# Patient Record
Sex: Female | Born: 1960 | Race: White | Hispanic: No | Smoking: Never smoker
Health system: Southern US, Community
[De-identification: ages and names within clinical notes are randomized; demographics above are authoritative.]

## PROBLEM LIST (undated history)

## (undated) DIAGNOSIS — E7800 Pure hypercholesterolemia, unspecified: Secondary | ICD-10-CM

## (undated) DIAGNOSIS — N879 Dysplasia of cervix uteri, unspecified: Secondary | ICD-10-CM

## (undated) DIAGNOSIS — M47812 Spondylosis without myelopathy or radiculopathy, cervical region: Secondary | ICD-10-CM

## (undated) DIAGNOSIS — F341 Dysthymic disorder: Secondary | ICD-10-CM

## (undated) DIAGNOSIS — R42 Dizziness and giddiness: Secondary | ICD-10-CM

## (undated) DIAGNOSIS — L981 Factitial dermatitis: Secondary | ICD-10-CM

## (undated) DIAGNOSIS — F424 Excoriation (skin-picking) disorder: Secondary | ICD-10-CM

## (undated) DIAGNOSIS — F419 Anxiety disorder, unspecified: Secondary | ICD-10-CM

## (undated) DIAGNOSIS — G609 Hereditary and idiopathic neuropathy, unspecified: Secondary | ICD-10-CM

## (undated) DIAGNOSIS — G9332 Myalgic encephalomyelitis/chronic fatigue syndrome: Secondary | ICD-10-CM

## (undated) DIAGNOSIS — M722 Plantar fascial fibromatosis: Secondary | ICD-10-CM

## (undated) DIAGNOSIS — R5382 Chronic fatigue, unspecified: Secondary | ICD-10-CM

## (undated) DIAGNOSIS — E669 Obesity, unspecified: Secondary | ICD-10-CM

## (undated) DIAGNOSIS — M797 Fibromyalgia: Secondary | ICD-10-CM

## (undated) HISTORY — PX: ADENOIDECTOMY: SUR15

## (undated) HISTORY — PX: TUBAL LIGATION: SHX77

## (undated) HISTORY — PX: CARPAL TUNNEL RELEASE: SHX101

## (undated) HISTORY — PX: OTHER SURGICAL HISTORY: SHX169

## (undated) HISTORY — DX: Dysplasia of cervix uteri, unspecified: N87.9

## (undated) HISTORY — PX: TONSILLECTOMY: SUR1361

## (undated) HISTORY — PX: ABDOMINAL HYSTERECTOMY: SHX81

---

## 1980-10-18 DIAGNOSIS — H8110 Benign paroxysmal vertigo, unspecified ear: Secondary | ICD-10-CM | POA: Insufficient documentation

## 1993-04-20 DIAGNOSIS — G43109 Migraine with aura, not intractable, without status migrainosus: Secondary | ICD-10-CM | POA: Insufficient documentation

## 1994-11-19 DIAGNOSIS — M797 Fibromyalgia: Secondary | ICD-10-CM | POA: Insufficient documentation

## 1995-08-19 DIAGNOSIS — M797 Fibromyalgia: Secondary | ICD-10-CM | POA: Insufficient documentation

## 1995-08-19 DIAGNOSIS — G9332 Myalgic encephalomyelitis/chronic fatigue syndrome: Secondary | ICD-10-CM | POA: Insufficient documentation

## 1997-04-20 DIAGNOSIS — K589 Irritable bowel syndrome without diarrhea: Secondary | ICD-10-CM | POA: Insufficient documentation

## 1999-04-21 DIAGNOSIS — F424 Excoriation (skin-picking) disorder: Secondary | ICD-10-CM | POA: Insufficient documentation

## 2011-05-11 ENCOUNTER — Emergency Department (INDEPENDENT_AMBULATORY_CARE_PROVIDER_SITE_OTHER)
Admission: EM | Admit: 2011-05-11 | Discharge: 2011-05-11 | Disposition: A | Discharge status: Home / Self Care | Payer: BC Managed Care – PPO | Source: Home / Self Care | Attending: Emergency Medicine | Admitting: Emergency Medicine

## 2011-05-11 DIAGNOSIS — J029 Acute pharyngitis, unspecified: Secondary | ICD-10-CM

## 2011-05-11 DIAGNOSIS — J069 Acute upper respiratory infection, unspecified: Secondary | ICD-10-CM

## 2011-05-11 HISTORY — DX: Dizziness and giddiness: R42

## 2011-05-11 HISTORY — DX: Chronic fatigue, unspecified: R53.82

## 2011-05-11 HISTORY — DX: Anxiety disorder, unspecified: F41.9

## 2011-05-11 HISTORY — DX: Pure hypercholesterolemia, unspecified: E78.00

## 2011-05-11 HISTORY — DX: Spondylosis without myelopathy or radiculopathy, cervical region: M47.812

## 2011-05-11 HISTORY — DX: Excoriation (skin-picking) disorder: F42.4

## 2011-05-11 HISTORY — DX: Fibromyalgia: M79.7

## 2011-05-11 HISTORY — DX: Factitial dermatitis: L98.1

## 2011-05-11 HISTORY — DX: Dysthymic disorder: F34.1

## 2011-05-11 HISTORY — DX: Obesity, unspecified: E66.9

## 2011-05-11 HISTORY — DX: Myalgic encephalomyelitis/chronic fatigue syndrome: G93.32

## 2011-05-11 HISTORY — DX: Plantar fascial fibromatosis: M72.2

## 2011-05-11 HISTORY — DX: Hereditary and idiopathic neuropathy, unspecified: G60.9

## 2011-05-11 LAB — POCT RAPID STREP A (OFFICE): Rapid Strep A Screen: NEGATIVE

## 2011-05-11 MED ORDER — AZITHROMYCIN 250 MG PO TABS: ORAL_TABLET | ORAL | Status: AC

## 2011-05-11 NOTE — ED Provider Notes (Addendum)
History     CSN: [id number]  Arrival date & time 05/11/11  1011   First MD Initiated Contact with Patient 05/11/11 1016      No chief complaint on file.   (Consider location/radiation/quality/duration/timing/severity/associated sxs/prior treatment) HPI Noeli is a 51 y.o. female who complains of onset of cold symptoms for a few days. Flonase is helping. + sore throat + cough No pleuritic pain No wheezing + nasal congestion + post-nasal drainage No sinus pain/pressure No chest congestion No itchy/red eyes No earache No hemoptysis No SOB No chills/sweats No fever No nausea No vomiting No abdominal pain No diarrhea No skin rashes No fatigue No myalgias No headache    No past medical history on file.  No past surgical history on file.  No family history on file.  History  Substance Use Topics  . Smoking status: Not on file  . Smokeless tobacco: Not on file  . Alcohol Use: Not on file    OB History    No data available      Review of Systems  Allergies  Review of patient's allergies indicates not on file.  Home Medications  No current outpatient prescriptions on file.  There were no vitals taken for this visit.  Physical Exam  Nursing note and vitals reviewed. Constitutional: She is oriented to person, place, and time. She appears well-developed and well-nourished.  HENT:  Head: Normocephalic and atraumatic.  Right Ear: Tympanic membrane, external ear and ear canal normal.  Left Ear: Tympanic membrane, external ear and ear canal normal.  Nose: Mucosal edema and rhinorrhea present.  Mouth/Throat: Posterior oropharyngeal erythema present. No oropharyngeal exudate or posterior oropharyngeal edema.  Eyes: No scleral icterus.  Neck: Neck supple.  Cardiovascular: Regular rhythm and normal heart sounds.   Pulmonary/Chest: Effort normal and breath sounds normal. No respiratory distress.  Neurological: She is alert and oriented to person, place, and  time.  Skin: Skin is warm and dry.  Psychiatric: She has a normal mood and affect. Her speech is normal.    ED Course  Procedures (including critical care time)  Labs Reviewed - No data to display No results found.   No diagnosis found.    MDM  1)  Take the prescribed antibiotic as instructed.  She is allergic to most classes of antibiotics but states that she can take a Z-Pak without any problems.  A rapid strep test is negative and a throat culture is pending. 2)  Use nasal saline solution (over the counter) at least 3 times a day. 3)  Use over the counter decongestants like Zyrtec-D every 12 hours as needed to help with congestion.  If you have hypertension, do not take medicines with sudafed.  4)  Can take tylenol every 6 hours or motrin every 8 hours for pain or fever. 5)  Follow up with your primary doctor if no improvement in 5-7 days, sooner if increasing pain, fever, or new symptoms.       Lily Kocher, MD 05/11/11 1046

## 2011-05-11 NOTE — ED Notes (Signed)
Pt c/o sore throat for past 4 days.

## 2011-05-12 LAB — STREP A DNA PROBE: GASP: NEGATIVE

## 2011-05-13 ENCOUNTER — Telehealth: Payer: Self-pay | Admitting: Emergency Medicine

## 2011-06-20 ENCOUNTER — Emergency Department
Admit: 2011-06-20 | Discharge: 2011-06-20 | Disposition: A | Discharge status: Home / Self Care | Payer: BC Managed Care – PPO

## 2011-06-20 ENCOUNTER — Emergency Department (INDEPENDENT_AMBULATORY_CARE_PROVIDER_SITE_OTHER)
Admission: EM | Admit: 2011-06-20 | Discharge: 2011-06-20 | Disposition: A | Discharge status: Home / Self Care | Payer: BC Managed Care – PPO | Source: Home / Self Care | Attending: Family Medicine | Admitting: Family Medicine

## 2011-06-20 DIAGNOSIS — R079 Chest pain, unspecified: Secondary | ICD-10-CM

## 2011-06-20 DIAGNOSIS — Z8739 Personal history of other diseases of the musculoskeletal system and connective tissue: Secondary | ICD-10-CM

## 2011-06-20 DIAGNOSIS — R0781 Pleurodynia: Secondary | ICD-10-CM

## 2011-06-20 DIAGNOSIS — M940 Chondrocostal junction syndrome [Tietze]: Secondary | ICD-10-CM

## 2011-06-20 DIAGNOSIS — R059 Cough, unspecified: Secondary | ICD-10-CM

## 2011-06-20 DIAGNOSIS — R05 Cough: Secondary | ICD-10-CM

## 2011-06-20 NOTE — ED Provider Notes (Signed)
History     CSN: [id number]  Arrival date & time 06/20/11  1401   First MD Initiated Contact with Patient 06/20/11 1423      Chief Complaint  Patient presents with  . Chest Pain    right side pain under right breast      HPI Comments: Patient had a cough about 3 weeks ago.  She felt a sudden painful pulling sensation in her right anterior/lateral chest while coughing that has persisted.  The pain is occasionally burning/tingling.  No rash.  No fevers, chills, and sweats.  She occasionally feels short of breath because of the pain.  The pain does not radiate.  She has a history of costochondritis and fibromyalgia.  Patient is a 51 y.o. female presenting with chest pain. The history is provided by the patient.  Chest Pain Episode onset: 3 weeks ago. Chest pain occurs constantly. The chest pain is unchanged. The pain is associated with breathing and coughing. At its most intense, the pain is at 6/10. The severity of the pain is moderate. The quality of the pain is described as stabbing and sharp. The pain does not radiate. Chest pain is worsened by certain positions and deep breathing. Pertinent negatives for primary symptoms include no fever, no fatigue, no syncope, no shortness of breath, no cough, no wheezing, no palpitations, no abdominal pain, no nausea and no vomiting.  Pertinent negatives for associated symptoms include no lower extremity edema. Risk factors include obesity.     Past Medical History  Diagnosis Date  . Fibromyalgia   . Chronic fatigue syndrome   . Hypercholesteremia   . Dysthymia   . Anxiety   . Hypotension   . Dermatillomania   . Idiopathic neuropathy   . Plantar fasciitis   . Cervical spondylosis   . Vertigo   . Obesity     Past Surgical History  Procedure Date  . Tonsillectomy   . Carpal tunnel release   . Tenosynovitis   . Abdominal hysterectomy     Family History  Problem Relation Age of Onset  . Thyroid disease Mother   . Heart failure  Father   . Diabetes Father   . Cancer Father     History  Substance Use Topics  . Smoking status: Not on file  . Smokeless tobacco: Not on file  . Alcohol Use: No    OB History    Grav Para Term Preterm Abortions TAB SAB Ect Mult Living                  Review of Systems  Constitutional: Negative for fever and fatigue.  Respiratory: Negative for cough, shortness of breath and wheezing.   Cardiovascular: Positive for chest pain. Negative for palpitations and syncope.  Gastrointestinal: Negative for nausea, vomiting and abdominal pain.  All other systems reviewed and are negative.    Allergies  Amoxicillin; Avelox; Clindamycin/lincomycin; Erythromycin; Flagyl; Macrolides and ketolides; Penicillins; and Sulfa antibiotics  Home Medications   Current Outpatient Rx  Name Route Sig Dispense Refill  . CLONAZEPAM 0.5 MG PO TABS Oral Take 0.5 mg by mouth at bedtime as needed.    Marland Kitchen FLUTICASONE PROPIONATE 50 MCG/ACT NA SUSP Nasal Place 2 sprays into the nose 2 (two) times daily.    Marland Kitchen NEURONTIN PO Oral Take 1,200 mg by mouth.    Marland Kitchen LEVOTHYROXINE SODIUM 50 MCG PO TABS Oral Take 50 mcg by mouth daily.    . SERTRALINE HCL 100 MG PO TABS Oral Take 100  mg by mouth daily.    Marland Kitchen VITAMIN D (ERGOCALCIFEROL) 50000 UNITS PO CAPS Oral Take 5,000 Units by mouth.      BP 117/69  Pulse 66  Temp(Src) 97.8 F (36.6 C) (Oral)  Resp 16  Ht 5\' 5"  (1.651 m)  Wt 246 lb (111.585 kg)  BMI 40.94 kg/m2  SpO2 98%  Physical Exam  Nursing note and vitals reviewed. Constitutional: She is oriented to person, place, and time. She appears well-developed and well-nourished. No distress.       Patient is morbidly obese (BMI 41)    HENT:  Head: Normocephalic.  Nose: Nose normal.  Mouth/Throat: Oropharynx is clear and moist.  Eyes: Conjunctivae and EOM are normal. Pupils are equal, round, and reactive to light.  Neck: Normal range of motion. Neck supple.  Cardiovascular: Normal rate, regular rhythm and  normal heart sounds.   Pulmonary/Chest: Effort normal and breath sounds normal. She exhibits tenderness.       Chest:  Distinct tenderness to palpation over the mid-sternum.   Abdominal: Soft. Bowel sounds are normal. There is no tenderness.  Musculoskeletal: She exhibits no edema.       Arms:      Over the right anterior/inferior chest is well-localized tenderness to palpation  Lymphadenopathy:    She has no cervical adenopathy.  Neurological: She is alert and oriented to person, place, and time.  Skin: Skin is warm and dry. No rash noted.    ED Course  Procedures  none    *RADIOLOGY REPORT*   Clinical Data: Right anterior rib pain   CHEST - 2 VIEW   Comparison: None   Findings: Normal mediastinum and cardiac silhouette.  Normal pulmonary  vasculature.  No evidence of effusion, infiltrate, or pneumothorax.  No acute bony abnormality.  There is a calcified nodule in the right lower lobe.   IMPRESSION:   1. No acute cardiopulmonary process. 2.  Likely calcified granuloma in the right lower lobe the   Original Report Authenticated By: Genevive Bi, M.D. *RADIOLOGY REPORT*   Clinical Data: Anterior rib pain   RIGHT RIBS - 2 VIEW   Comparison: Chest radiograph same day   Findings: Dedicated views of the right ribs demonstrate no osseous abnormality.   IMPRESSION: No rib abnormality.   Original Report Authenticated By: Genevive Bi, M.D.    1. Rib pain on left side,exacerbated by fibromyalgia 2.  Chronic costochondritis 3.  History of Fibromyalgia.      MDM  Rib belt applied. Wear rib belt until improved.  Apply ice pack several times daily Followup with PCP if not improving or if symptoms worsen        Donna Christen, MD 06/22/11 1558

## 2011-06-20 NOTE — Discharge Instructions (Signed)
Wear rib belt until improved.  Apply ice pack several times daily

## 2011-06-20 NOTE — ED Notes (Addendum)
Patient states she was sick a few weeks ago with flu like symptoms and when she coughed and sneezed she felt something pop. She has taken Motrin and applied heat with little relief. She just wants to make sure she does not have something going on with her lungs. She decribes pain as sharp stabbing and is 6/10 on a pain scale. Pain is better when she is at rest.

## 2012-02-10 ENCOUNTER — Emergency Department
Admission: EM | Admit: 2012-02-10 | Discharge: 2012-02-10 | Disposition: A | Discharge status: Home / Self Care | Payer: BC Managed Care – PPO | Source: Home / Self Care | Attending: Family Medicine | Admitting: Family Medicine

## 2012-02-10 ENCOUNTER — Encounter: Payer: Self-pay | Admitting: *Deleted

## 2012-02-10 DIAGNOSIS — G5711 Meralgia paresthetica, right lower limb: Secondary | ICD-10-CM

## 2012-02-10 DIAGNOSIS — G5710 Meralgia paresthetica, unspecified lower limb: Secondary | ICD-10-CM

## 2012-02-10 NOTE — ED Notes (Signed)
Patient c/o 2 weeks of right upper lateral thigh numbness, painful to touch. Recently she has developed a pins and needle like pain associated with it. The pain worsens throughout the day. She reports her mother had Meralgia Paresthetica, which is similar to her symptoms. Her skin is unremarkable, negative for redness, heat or streaking.

## 2012-02-10 NOTE — ED Provider Notes (Signed)
History     CSN: [id number]  Arrival date & time 02/10/12  4098   First MD Initiated Contact with Patient 02/10/12 [phone]      Chief Complaint  Patient presents with  . Leg Pain    right lateral thigh      HPI Comments: Patient complains of two week history of vague paresthesia localized over her right anterior/lateral thigh.  The discomfort started as a burning sensation, and now has become more of an itching/stinging sensation.  The pain is somewhat worse at night, and can be improved somewhat by moving or changing position.  She has discomfort walking on stairs.  No recent injury to the thigh or hip area.  She notices that the discomfort improves considerably when she wears loose clothes.  She admits that she has gained considerable weight, and over the past 3 months has probably gained about 15 pounds.  She admits that her 12 pound cat likes to sleep on her right thigh at night.  She also has a history of fibromyalgia, for which she takes Neurontin.  She had decreased her dose down to 600mg  at bedtime, and now wonders if should increase the dose.  She denies calf pain or swelling. She states that her mother had similar symptoms, with a diagnosis of Meralgia paresthetica, and she believes that she has the same.  She has a history of Vitamin D deficiency and has recently resumed a prescription dose of Vit D.  Patient is a 51 y.o. female presenting with leg pain. The history is provided by the patient.  Leg Pain  Incident onset: 3 weeks ago. The incident occurred at work. There was no injury mechanism. Pain location: right anterior/lateral thigh. The quality of the pain is described as burning and tingling. The pain is at a severity of 6/10. The pain is mild. The pain has been fluctuating since onset. Associated symptoms include numbness and tingling. Pertinent negatives include no inability to bear weight, no loss of motion, no muscle weakness and no loss of sensation. Exacerbated by: certain  positions. She has tried NSAIDs for the symptoms. The treatment provided mild relief.    Past Medical History  Diagnosis Date  . Fibromyalgia   . Chronic fatigue syndrome   . Hypercholesteremia   . Dysthymia   . Anxiety   . Hypotension   . Dermatillomania   . Idiopathic neuropathy   . Plantar fasciitis   . Cervical spondylosis   . Vertigo   . Obesity     Past Surgical History  Procedure Date  . Tonsillectomy   . Carpal tunnel release   . Tenosynovitis   . Abdominal hysterectomy     Family History  Problem Relation Age of Onset  . Thyroid disease Mother   . Heart failure Father   . Diabetes Father   . Cancer Father     History  Substance Use Topics  . Smoking status: Never Smoker   . Smokeless tobacco: Not on file  . Alcohol Use: No    OB History    Grav Para Term Preterm Abortions TAB SAB Ect Mult Living                  Review of Systems  Neurological: Positive for tingling and numbness.  All other systems reviewed and are negative.    Allergies  Amoxicillin; Avelox; Clindamycin/lincomycin; Erythromycin; Flagyl; Macrobid; Macrolides and ketolides; Penicillins; and Sulfa antibiotics  Home Medications   Current Outpatient Rx  Name Route Sig Dispense  Refill  . L-METHYLFOLATE-B6-B12 3-35-2 MG PO TABS Oral Take 1 tablet by mouth daily.    Marland Kitchen CLONAZEPAM 0.5 MG PO TABS Oral Take 0.5 mg by mouth at bedtime as needed.    Marland Kitchen FLUTICASONE PROPIONATE 50 MCG/ACT NA SUSP Nasal Place 2 sprays into the nose 2 (two) times daily.    Marland Kitchen NEURONTIN PO Oral Take 1,200 mg by mouth.    Marland Kitchen LEVOTHYROXINE SODIUM 50 MCG PO TABS Oral Take 50 mcg by mouth daily.    . SERTRALINE HCL 100 MG PO TABS Oral Take 100 mg by mouth daily.    Marland Kitchen VITAMIN D (ERGOCALCIFEROL) 50000 UNITS PO CAPS Oral Take 5,000 Units by mouth.      BP 120/76  Pulse 87  Temp 98 F (36.7 C) (Oral)  Resp 14  Ht 5\' 5"  (1.651 m)  Wt 257 lb (116.574 kg)  BMI 42.77 kg/m2  SpO2 96%  Physical Exam Nursing notes  and Vital Signs reviewed. Appearance:  Patient appears stated age, and in no acute distress.  Patient is obese (BMI 42.8) Eyes:  Pupils are equal, round, and reactive to light and accomodation.  Extraocular movement is intact.  Conjunctivae are not inflamed  Pharynx:  Normal Neck:  Supple.   No adenopathy Lungs:  Clear to auscultation.  Breath sounds are equal.  Heart:  Regular rate and rhythm without murmurs, rubs, or gallops.  Abdomen:  No distinct tenderness although there are multiple trigger points.  No masses or hepatosplenomegaly.  Bowel sounds are present.  No CVA or flank tenderness.  Palpation over the right inguinal area seems to cause slight increase in her paresthesias right anterior/lateral thigh. Back:  No definite tenderness other than trigger point tenderness of fibromyalgia.  Negative straight leg raise and sitting knee extension tests. Neuro:  Patellar and achilles reflexes normal.  Slight decrease in sensation over right anterior thigh. Extremities:  No edema.  No calf tenderness.  No definite tenderness over hip greater trochanters.  Pedal pulses intact. Skin:  No rash present.   ED Course  Procedures  none      1. Meralgia paresthetica of right side.  Agree with patient's self-assessment.      MDM   Increase Neurontin to 900mg  at bedtime.  Wear loose clothing.  Leave cat outside of bedroom at night.  Recommend multivitamin.  May take Ibuprofen 200mg , 4 tabs every 8 hours with food as needed. Followup with Dr. Meyer Russel if not improving (may need nerve conduction studies)        Lattie Haw, MD 02/10/12 [phone]

## 2012-04-25 ENCOUNTER — Encounter: Payer: Self-pay | Admitting: *Deleted

## 2012-04-25 ENCOUNTER — Emergency Department
Admission: EM | Admit: 2012-04-25 | Discharge: 2012-04-25 | Disposition: A | Discharge status: Home / Self Care | Payer: BC Managed Care – PPO | Source: Home / Self Care | Attending: Family Medicine | Admitting: Family Medicine

## 2012-04-25 DIAGNOSIS — R6889 Other general symptoms and signs: Secondary | ICD-10-CM

## 2012-04-25 DIAGNOSIS — J069 Acute upper respiratory infection, unspecified: Secondary | ICD-10-CM

## 2012-04-25 LAB — POCT INFLUENZA A/B
Influenza A, POC: NEGATIVE
Influenza B, POC: NEGATIVE

## 2012-04-25 LAB — POCT RAPID STREP A (OFFICE): Rapid Strep A Screen: NEGATIVE

## 2012-04-25 MED ORDER — OSELTAMIVIR PHOSPHATE 75 MG PO CAPS: 75.0000 mg | ORAL_CAPSULE | Freq: Two times a day (BID) | ORAL | Status: AC

## 2012-04-25 NOTE — ED Notes (Signed)
Pt c/o fever, cough, chills and sore throat x last night. No OTC meds. The pt did not receive a flu vaccine.

## 2012-04-25 NOTE — ED Provider Notes (Signed)
History     CSN: [id number]  Arrival date & time 04/25/12  1722   First MD Initiated Contact with Patient 04/25/12 1732      Chief Complaint  Patient presents with  . Fever  . Chills  . Cough   HPI  URI Symptoms Onset: 1 days  Description: generalized malaise, chills, fever, rhinorrhea, nasal congestion, cough Modifying factors:  Has not had flu shot  Symptoms Nasal discharge: yes Fever: yes Sore throat: mild Cough: mild Wheezing: no Ear pain: no GI symptoms: no Sick contacts: yes  Red Flags  Stiff neck: no Dyspnea: no Rash: no Swallowing difficulty: no  Sinusitis Risk Factors Headache/face pain: no Double sickening: no tooth pain: no  Allergy Risk Factors Sneezing: no Itchy scratchy throat: no Seasonal symptoms: no  Flu Risk Factors Headache: yes muscle aches: yes severe fatigue: yes    Past Medical History  Diagnosis Date  . Fibromyalgia   . Chronic fatigue syndrome   . Hypercholesteremia   . Dysthymia   . Anxiety   . Hypotension   . Dermatillomania   . Idiopathic neuropathy   . Plantar fasciitis   . Cervical spondylosis   . Vertigo   . Obesity     Past Surgical History  Procedure Date  . Tonsillectomy   . Carpal tunnel release   . Tenosynovitis   . Abdominal hysterectomy     Family History  Problem Relation Age of Onset  . Thyroid disease Mother   . Heart failure Father   . Diabetes Father   . Cancer Father     History  Substance Use Topics  . Smoking status: Never Smoker   . Smokeless tobacco: Not on file  . Alcohol Use: No    OB History    Grav Para Term Preterm Abortions TAB SAB Ect Mult Living                  Review of Systems  All other systems reviewed and are negative.    Allergies  Amoxicillin; Avelox; Clindamycin/lincomycin; Erythromycin; Flagyl; Macrobid; Macrolides and ketolides; Penicillins; and Sulfa antibiotics  Home Medications   Current Outpatient Rx  Name  Route  Sig  Dispense  Refill  .  CLONAZEPAM 0.5 MG PO TABS   Oral   Take 0.5 mg by mouth at bedtime as needed.         Marland Kitchen FLUTICASONE PROPIONATE 50 MCG/ACT NA SUSP   Nasal   Place 2 sprays into the nose 2 (two) times daily.         Marland Kitchen NEURONTIN PO   Oral   Take 1,200 mg by mouth.         . L-METHYLFOLATE-B6-B12 3-35-2 MG PO TABS   Oral   Take 1 tablet by mouth daily.         Marland Kitchen LEVOTHYROXINE SODIUM 50 MCG PO TABS   Oral   Take 50 mcg by mouth daily.         . OSELTAMIVIR PHOSPHATE 75 MG PO CAPS   Oral   Take 1 capsule (75 mg total) by mouth 2 (two) times daily.   10 capsule   0   . SERTRALINE HCL 100 MG PO TABS   Oral   Take 100 mg by mouth daily.         Marland Kitchen VITAMIN D (ERGOCALCIFEROL) 50000 UNITS PO CAPS   Oral   Take 5,000 Units by mouth.           BP 120/80  Pulse 94  Temp 100.8 F (38.2 C) (Oral)  Resp 18  Wt 261 lb (118.389 kg)  SpO2 98%  Physical Exam  Constitutional: She is oriented to person, place, and time. She appears well-developed and well-nourished.  HENT:  Head: Normocephalic and atraumatic.  Right Ear: External ear normal.  Left Ear: External ear normal.       +nasal erythema, rhinorrhea bilaterally, + post oropharyngeal erythema    Eyes: Conjunctivae normal are normal. Pupils are equal, round, and reactive to light.  Neck: Normal range of motion. Neck supple.  Cardiovascular: Normal rate, regular rhythm and normal heart sounds.   Pulmonary/Chest: Effort normal and breath sounds normal.  Abdominal: Soft. Bowel sounds are normal.  Musculoskeletal: Normal range of motion.  Neurological: She is alert and oriented to person, place, and time.    ED Course  Procedures (including critical care time)  Labs Reviewed - No data to display No results found.   1. URI (upper respiratory infection)   2. Flu-like symptoms       MDM  Rapid flu negative.  Will treat as flu like illness. Tamiflu  Discussed supportive care and infectious red flags.  Follow up as  needed.     The patient and/or caregiver has been counseled thoroughly with regard to treatment plan and/or medications prescribed including dosage, schedule, interactions, rationale for use, and possible side effects and they verbalize understanding. Diagnoses and expected course of recovery discussed and will return if not improved as expected or if the condition worsens. Patient and/or caregiver verbalized understanding.             Doree Albee, MD 04/25/12 1810

## 2012-04-28 ENCOUNTER — Telehealth: Payer: Self-pay | Admitting: *Deleted

## 2012-04-28 NOTE — ED Notes (Unsigned)
pt called reports that she feels better but still has a cough and hoarseness. she would like to have her work note extended. advised her I would extend her work note to return 04/29/12. she requested it be extended longer and I advised her she would need a follow up visit for further work note. Pt agrees.

## 2012-11-14 ENCOUNTER — Emergency Department
Admission: EM | Admit: 2012-11-14 | Discharge: 2012-11-14 | Disposition: A | Discharge status: Home / Self Care | Payer: BC Managed Care – PPO | Source: Home / Self Care | Attending: Emergency Medicine | Admitting: Emergency Medicine

## 2012-11-14 ENCOUNTER — Encounter: Payer: Self-pay | Admitting: *Deleted

## 2012-11-14 DIAGNOSIS — S139XXA Sprain of joints and ligaments of unspecified parts of neck, initial encounter: Secondary | ICD-10-CM

## 2012-11-14 DIAGNOSIS — S161XXA Strain of muscle, fascia and tendon at neck level, initial encounter: Secondary | ICD-10-CM

## 2012-11-14 MED ORDER — CYCLOBENZAPRINE HCL 10 MG PO TABS: 10.0000 mg | ORAL_TABLET | Freq: Three times a day (TID) | ORAL | Status: DC | PRN

## 2012-11-14 MED ORDER — MELOXICAM 15 MG PO TABS: 15.0000 mg | ORAL_TABLET | Freq: Every day | ORAL | Status: DC

## 2012-11-14 MED ORDER — DICLOFENAC SODIUM 1 % TD GEL: TRANSDERMAL | Status: DC

## 2012-11-14 NOTE — ED Notes (Signed)
Barbara Wall c/o right shoulder pain x 1 1/2 weeks. She reports the pain started in her left shoulder after carrying a heavy bag up stairs. 2 days later she reports the pain "moved from her left to my right shoulder". Taking motrin with relief, pain 10/10 without medication. Numbness in right arm.

## 2012-11-14 NOTE — ED Provider Notes (Signed)
CSN: [id number]     Arrival date & time 11/14/12  4098 History     First MD Initiated Contact with Patient 11/14/12 [phone]     Chief Complaint  Patient presents with  . Shoulder Pain    right   (Consider location/radiation/quality/duration/timing/severity/associated sxs/prior Treatment) HPI The patient presents today with neck pain. Location:  Right sided Description: A few days she was carrying a bag on her left shoulder and developed some mild left shoulder pain but then woke up the following day with it in her right shoulder and neck.  She has a history of cervical spondylosis and fibromyalgia.  She is taking a lot of Motrin with some relief but when the medicine wears off it hurts a lot more.  She has mild numbness in her right arm which is consistent with chronic problems.  No radiation of pain  No weakness  No overuse  No trauma    No fever No bowel/bladder incontinence      Past Medical History  Diagnosis Date  . Fibromyalgia   . Chronic fatigue syndrome   . Hypercholesteremia   . Dysthymia   . Anxiety   . Hypotension   . Dermatillomania   . Idiopathic neuropathy   . Plantar fasciitis   . Cervical spondylosis   . Vertigo   . Obesity    Past Surgical History  Procedure Laterality Date  . Tonsillectomy    . Carpal tunnel release    . Tenosynovitis    . Abdominal hysterectomy    . Adenoidectomy    . Tubal ligation     Family History  Problem Relation Age of Onset  . Thyroid disease Mother   . Heart failure Father   . Diabetes Father   . Cancer Father    History  Substance Use Topics  . Smoking status: Never Smoker   . Smokeless tobacco: Not on file  . Alcohol Use: No   OB History   Grav Para Term Preterm Abortions TAB SAB Ect Mult Living                 Review of Systems  All other systems reviewed and are negative.    Allergies  Amoxicillin; Avelox; Clindamycin/lincomycin; Erythromycin; Flagyl; Macrobid; Macrolides and ketolides; Penicillins;  and Sulfa antibiotics  Home Medications   Current Outpatient Rx  Name  Route  Sig  Dispense  Refill  . clonazePAM (KLONOPIN) 0.5 MG tablet   Oral   Take 0.5 mg by mouth at bedtime as needed.         . cyclobenzaprine (FLEXERIL) 10 MG tablet   Oral   Take 1 tablet (10 mg total) by mouth 3 (three) times daily as needed for muscle spasms.   21 tablet   0   . diclofenac sodium (VOLTAREN) 1 % GEL      Apply to affected area 3-4 times per day as needed, Disp 3 tubes   100 g   2   . fluticasone (FLONASE) 50 MCG/ACT nasal spray   Nasal   Place 2 sprays into the nose 2 (two) times daily.         . Gabapentin (NEURONTIN PO)   Oral   Take 1,200 mg by mouth.         Marland Kitchen l-methylfolate-B6-B12 (METANX) 3-35-2 MG TABS   Oral   Take 1 tablet by mouth daily.         Marland Kitchen levothyroxine (SYNTHROID, LEVOTHROID) 50 MCG tablet   Oral  Take 50 mcg by mouth daily.         . meloxicam (MOBIC) 15 MG tablet   Oral   Take 1 tablet (15 mg total) by mouth daily.   21 tablet   0   . sertraline (ZOLOFT) 100 MG tablet   Oral   Take 100 mg by mouth daily.         . Vitamin D, Ergocalciferol, (DRISDOL) 50000 UNITS CAPS   Oral   Take 5,000 Units by mouth.          BP 130/85  Pulse 78  Resp 18  Ht 5\' 5"  (1.651 m)  Wt 255 lb (115.667 kg)  BMI 42.43 kg/m2  SpO2 96% Physical Exam  Nursing note and vitals reviewed. Constitutional: She is oriented to person, place, and time. She appears well-developed and well-nourished.  HENT:  Head: Normocephalic and atraumatic.  Eyes: No scleral icterus.  Neck: Neck supple.  Cardiovascular: Regular rhythm and normal heart sounds.   Pulmonary/Chest: Effort normal and breath sounds normal. No respiratory distress.  Musculoskeletal:       Cervical back: She exhibits tenderness, pain and spasm. She exhibits no bony tenderness.       Back:  Neurological: She is alert and oriented to person, place, and time. She has normal strength and normal  reflexes. No sensory deficit. GCS eye subscore is 4. GCS verbal subscore is 5. GCS motor subscore is 6.  Skin: Skin is warm and dry. No rash noted.  Psychiatric: She has a normal mood and affect. Her speech is normal.    ED Course   Procedures (including critical care time)  Labs Reviewed - No data to display No results found. 1. Cervical muscle strain, initial encounter     MDM   This patient has apparent cervical muscle strain on the right side.  She also is has a history of fibromyalgia.  I will treat her with a meloxicam once a day, Flexeril for a few days mostly at night, and I gave her a sample and prescription of topical Voltaren gel.  The Voltaren gel may also help with her fibromyalgia so she is going to keep it on hand for future.  No x-rays were done today since I don't see any evidence of bony trauma.  However if the pain continues, then I cervical x-ray series may be appropriate at that time.  Marlaine Hind, MD 11/14/12 [phone]

## 2013-02-13 ENCOUNTER — Emergency Department (INDEPENDENT_AMBULATORY_CARE_PROVIDER_SITE_OTHER): Payer: BC Managed Care – PPO

## 2013-02-13 ENCOUNTER — Emergency Department (INDEPENDENT_AMBULATORY_CARE_PROVIDER_SITE_OTHER)
Admission: EM | Admit: 2013-02-13 | Discharge: 2013-02-13 | Disposition: A | Discharge status: Home / Self Care | Payer: BC Managed Care – PPO | Source: Home / Self Care | Attending: Emergency Medicine | Admitting: Emergency Medicine

## 2013-02-13 ENCOUNTER — Encounter: Payer: Self-pay | Admitting: Emergency Medicine

## 2013-02-13 DIAGNOSIS — S6391XA Sprain of unspecified part of right wrist and hand, initial encounter: Secondary | ICD-10-CM

## 2013-02-13 DIAGNOSIS — S6390XA Sprain of unspecified part of unspecified wrist and hand, initial encounter: Secondary | ICD-10-CM

## 2013-02-13 DIAGNOSIS — M79609 Pain in unspecified limb: Secondary | ICD-10-CM

## 2013-02-13 MED ORDER — IBUPROFEN 200 MG PO TABS: ORAL_TABLET | ORAL | Status: DC

## 2013-02-13 NOTE — ED Notes (Signed)
Barbara Wall repots handling a large [employer] water jug last night injuring/straining right hand.

## 2013-02-13 NOTE — ED Provider Notes (Signed)
CSN: [id number]     Arrival date & time 02/13/13  4098 History   First MD Initiated Contact with Patient 02/13/13 [phone]     Chief Complaint  Patient presents with  . Hand Injury   (Consider location/radiation/quality/duration/timing/severity/associated sxs/prior Treatment) Patient is a 52 y.o. female presenting with hand injury. The history is provided by the patient.  Hand Injury Location:  Hand Time since incident:  1 day Injury: yes   Mechanism of injury comment:  While at home, gripped and lifted a heavy water jug Hand location:  R hand Pain details:    Quality:  Aching and sharp   Radiates to:  Does not radiate   Severity:  Moderate   Onset quality:  Sudden   Timing:  Constant   Progression:  Unchanged Chronicity:  New Prior injury to area:  Yes Relieved by: Ibuprofen. Associated symptoms: decreased range of motion and stiffness   Associated symptoms: no back pain, no fever, no muscle weakness, no neck pain, no numbness, no swelling and no tingling     Past Medical History  Diagnosis Date  . Fibromyalgia   . Chronic fatigue syndrome   . Hypercholesteremia   . Dysthymia   . Anxiety   . Hypotension   . Dermatillomania   . Idiopathic neuropathy   . Plantar fasciitis   . Cervical spondylosis   . Vertigo   . Obesity    Past Surgical History  Procedure Laterality Date  . Tonsillectomy    . Carpal tunnel release    . Tenosynovitis    . Abdominal hysterectomy    . Adenoidectomy    . Tubal ligation     Family History  Problem Relation Age of Onset  . Thyroid disease Mother   . Heart failure Father   . Diabetes Father   . Cancer Father    History  Substance Use Topics  . Smoking status: Never Smoker   . Smokeless tobacco: Never Used  . Alcohol Use: No   OB History   Grav Para Term Preterm Abortions TAB SAB Ect Mult Living                 Review of Systems  Constitutional: Negative for fever.  Musculoskeletal: Positive for stiffness. Negative for  back pain and neck pain.  All other systems reviewed and are negative.    Allergies  Amoxicillin; Avelox; Clindamycin/lincomycin; Erythromycin; Flagyl; Macrobid; Macrolides and ketolides; Penicillins; and Sulfa antibiotics  Home Medications   Current Outpatient Rx  Name  Route  Sig  Dispense  Refill  . clonazePAM (KLONOPIN) 0.5 MG tablet   Oral   Take 0.5 mg by mouth at bedtime as needed.         . cyclobenzaprine (FLEXERIL) 10 MG tablet   Oral   Take 1 tablet (10 mg total) by mouth 3 (three) times daily as needed for muscle spasms.   21 tablet   0   . diclofenac sodium (VOLTAREN) 1 % GEL      Apply to affected area 3-4 times per day as needed, Disp 3 tubes   100 g   2   . fluticasone (FLONASE) 50 MCG/ACT nasal spray   Nasal   Place 2 sprays into the nose 2 (two) times daily.         . Gabapentin (NEURONTIN PO)   Oral   Take 1,200 mg by mouth.         Marland Kitchen l-methylfolate-B6-B12 (METANX) 3-35-2 MG TABS   Oral  Take 1 tablet by mouth daily.         Marland Kitchen levothyroxine (SYNTHROID, LEVOTHROID) 50 MCG tablet   Oral   Take 75 mcg by mouth daily.          . meloxicam (MOBIC) 15 MG tablet   Oral   Take 1 tablet (15 mg total) by mouth daily.   21 tablet   0   . sertraline (ZOLOFT) 100 MG tablet   Oral   Take 100 mg by mouth daily.         . Vitamin D, Ergocalciferol, (DRISDOL) 50000 UNITS CAPS   Oral   Take 5,000 Units by mouth.          BP 134/81  Pulse 73  Resp 14  Ht 5\' 5"  (1.651 m)  Wt 256 lb (116.121 kg)  BMI 42.6 kg/m2  SpO2 95% Physical Exam  Nursing note and vitals reviewed. Constitutional: She is oriented to person, place, and time. She appears well-developed and well-nourished. No distress.  HENT:  Head: Normocephalic and atraumatic.  Eyes: Conjunctivae and EOM are normal. Pupils are equal, round, and reactive to light. No scleral icterus.  Neck: Normal range of motion.  Cardiovascular: Normal rate.   Pulmonary/Chest: Effort normal.   Abdominal: She exhibits no distension.  Musculoskeletal:       Right wrist: Normal. She exhibits no tenderness.       Right hand: She exhibits decreased range of motion, tenderness (Diffusely), bony tenderness (Moderate, over fifth metacarpal) and swelling (minimal). She exhibits normal capillary refill, no deformity and no laceration. Normal sensation noted. Normal strength noted.  Neurological: She is alert and oriented to person, place, and time.  Skin: Skin is warm.  Psychiatric: She has a normal mood and affect.    ED Course  Procedures (including critical care time) Labs Review Labs Reviewed - No data to display Imaging Review Dg Hand Complete Right  02/13/2013   CLINICAL DATA:  Pain post trauma  EXAM: RIGHT HAND - COMPLETE 3+ VIEW  COMPARISON:  None.  FINDINGS: Frontal, oblique, and lateral views were obtained. There is no fracture or dislocation. Joint spaces appear intact. No erosive change.  IMPRESSION: No abnormality noted.   Electronically Signed   By: Bretta Bang M.D.   On: 02/13/2013 09:58    EKG Interpretation     Ventricular Rate:    PR Interval:    QRS Duration:   QT Interval:    QTC Calculation:   R Axis:     Text Interpretation:              MDM   1. Sprain of right hand, initial encounter    X-ray right hand normal. Discussed with patient. Rice Continue ibuprofen 600-800 mg OTC 3 times a day with food for pain, as that has been effective Continue right hand brace/wrap that she brings in from home, is wearing that has helped  Followup with orthopedist if no better one week, sooner when necessary Precautions discussed. Red flags discussed. Questions invited and answered. Patient voiced understanding and agreement.    Lajean Manes, MD 02/13/13 [phone]

## 2013-03-20 ENCOUNTER — Emergency Department
Admission: EM | Admit: 2013-03-20 | Discharge: 2013-03-20 | Disposition: A | Discharge status: Home / Self Care | Payer: BC Managed Care – PPO | Source: Home / Self Care | Attending: Family Medicine | Admitting: Family Medicine

## 2013-03-20 ENCOUNTER — Encounter: Payer: Self-pay | Admitting: Emergency Medicine

## 2013-03-20 DIAGNOSIS — L03811 Cellulitis of head [any part, except face]: Secondary | ICD-10-CM

## 2013-03-20 DIAGNOSIS — L02818 Cutaneous abscess of other sites: Secondary | ICD-10-CM

## 2013-03-20 MED ORDER — DOXYCYCLINE HYCLATE 100 MG PO TABS: 100.0000 mg | ORAL_TABLET | Freq: Two times a day (BID) | ORAL | Status: DC

## 2013-03-20 NOTE — ED Provider Notes (Signed)
CSN: [id number]     Arrival date & time 03/20/13  1613 History   First MD Initiated Contact with Patient 03/20/13 1636     Chief Complaint  Patient presents with  . Mass    back of head      HPI Comments: Patient believes that she may have had an insect bite on her left occipital scalp about 2 to 3 weeks ago.  The lesion itched initially and she admits that she scratched it frequently.  Over the past 10 days the lesion has enlarged and become more tender.  There has been no drainage.  She feels well otherwise.  No fevers, chills, and sweats.  She believes that her tetanus immunization is current.  The history is provided by the patient.    Past Medical History  Diagnosis Date  . Fibromyalgia   . Chronic fatigue syndrome   . Hypercholesteremia   . Dysthymia   . Anxiety   . Hypotension   . Dermatillomania   . Idiopathic neuropathy   . Plantar fasciitis   . Cervical spondylosis   . Vertigo   . Obesity    Past Surgical History  Procedure Laterality Date  . Tonsillectomy    . Carpal tunnel release    . Tenosynovitis    . Abdominal hysterectomy    . Adenoidectomy    . Tubal ligation     Family History  Problem Relation Age of Onset  . Thyroid disease Mother   . Heart failure Father   . Diabetes Father   . Cancer Father    History  Substance Use Topics  . Smoking status: Never Smoker   . Smokeless tobacco: Never Used  . Alcohol Use: No   OB History   Grav Para Term Preterm Abortions TAB SAB Ect Mult Living                 Review of Systems  Constitutional: Negative for fever, chills, activity change and fatigue.  HENT:       Swelling left posterior scalp  Eyes: Negative.   Respiratory: Negative.   Cardiovascular: Negative.   Gastrointestinal: Negative.   Genitourinary: Negative.   Neurological: Negative for headaches.  Hematological: Negative for adenopathy.    Allergies  Amoxicillin; Avelox; Clindamycin/lincomycin; Erythromycin; Flagyl; Macrobid;  Macrolides and ketolides; Penicillins; and Sulfa antibiotics  Home Medications   Current Outpatient Rx  Name  Route  Sig  Dispense  Refill  . clonazePAM (KLONOPIN) 0.5 MG tablet   Oral   Take 0.5 mg by mouth at bedtime as needed.         . cyclobenzaprine (FLEXERIL) 10 MG tablet   Oral   Take 1 tablet (10 mg total) by mouth 3 (three) times daily as needed for muscle spasms.   21 tablet   0   . diclofenac sodium (VOLTAREN) 1 % GEL      Apply to affected area 3-4 times per day as needed, Disp 3 tubes   100 g   2   . doxycycline (VIBRA-TABS) 100 MG tablet   Oral   Take 1 tablet (100 mg total) by mouth 2 (two) times daily. Take with food.   20 tablet   0   . fluticasone (FLONASE) 50 MCG/ACT nasal spray   Nasal   Place 2 sprays into the nose 2 (two) times daily.         . Gabapentin (NEURONTIN PO)   Oral   Take 1,200 mg by mouth.         Marland Kitchen  ibuprofen (ADVIL,MOTRIN) 200 MG tablet      Take three tablets ( 600 milligrams total) every 6 with food as needed for pain.   30 tablet   0   . l-methylfolate-B6-B12 (METANX) 3-35-2 MG TABS   Oral   Take 1 tablet by mouth daily.         Marland Kitchen levothyroxine (SYNTHROID, LEVOTHROID) 50 MCG tablet   Oral   Take 75 mcg by mouth daily.          . meloxicam (MOBIC) 15 MG tablet   Oral   Take 1 tablet (15 mg total) by mouth daily.   21 tablet   0   . sertraline (ZOLOFT) 100 MG tablet   Oral   Take 100 mg by mouth daily.         . Vitamin D, Ergocalciferol, (DRISDOL) 50000 UNITS CAPS   Oral   Take 5,000 Units by mouth.          BP 106/69  Pulse 83  Temp(Src) 98.3 F (36.8 C) (Oral)  Resp 14  Wt 255 lb (115.667 kg)  SpO2 96% Physical Exam  Nursing note and vitals reviewed. Constitutional: She is oriented to person, place, and time. She appears well-developed and well-nourished. No distress.  HENT:  Head: Normocephalic and atraumatic.    On the patient's left occipital scalp, just within hairline is a 1cm  dia thin eschar with a minimal amount of clear fluid present.  The underlying scalp is tender but not indurated or fluctuant.  No adenopathy palpated  Eyes: Conjunctivae are normal. Pupils are equal, round, and reactive to light.  Neck: Neck supple.  Lymphadenopathy:    She has no cervical adenopathy.  Neurological: She is alert and oriented to person, place, and time.  Skin: Skin is warm and dry.    ED Course  Procedures  none    Labs Reviewed  WOUND CULTURE         MDM   1. Cellulitis of scalp    Wound culture pending.  Begin doxycycline.  Apply warm compress 3 to 4 times daily.  Apply Bacitracin ointment 3 times daily. Followup with Family Doctor if not improved in one week.     Lattie Haw, MD 03/20/13 [phone]

## 2013-03-20 NOTE — ED Notes (Signed)
Barbara Wall reports having a small bump on the back of her head that she later scratched. Over the last 10 days it has seemed to grow in size and induration. Site appears like a scab to the back of her head. C/o soreness with pressure.

## 2013-03-23 LAB — WOUND CULTURE
Gram Stain: NONE SEEN
Gram Stain: NONE SEEN
Gram Stain: NONE SEEN

## 2013-03-24 ENCOUNTER — Telehealth: Payer: Self-pay | Admitting: *Deleted

## 2013-08-04 ENCOUNTER — Encounter: Payer: Self-pay | Admitting: Emergency Medicine

## 2013-08-04 ENCOUNTER — Emergency Department
Admission: EM | Admit: 2013-08-04 | Discharge: 2013-08-04 | Disposition: A | Discharge status: Home / Self Care | Payer: BC Managed Care – PPO | Source: Home / Self Care | Attending: Family Medicine | Admitting: Family Medicine

## 2013-08-04 DIAGNOSIS — J029 Acute pharyngitis, unspecified: Secondary | ICD-10-CM

## 2013-08-04 LAB — POCT RAPID STREP A (OFFICE): Rapid Strep A Screen: NEGATIVE

## 2013-08-04 NOTE — ED Notes (Signed)
Barbara Wall c/o soreness on her lower left side of her esophagus when she swallows since yesterday. No past nasal drip, cough or fever.

## 2013-08-04 NOTE — Discharge Instructions (Signed)
If cold-like symptoms develop: Take plain Mucinex (1200 mg guaifenesin) twice daily for cough and congestion.  May add Sudafed for sinus congestion.   Increase fluid intake, rest. May use Afrin nasal spray (or generic oxymetazoline) twice daily for about 5 days.  Also recommend using saline nasal spray several times daily and saline nasal irrigation (AYR is a common brand) Try warm salt water gargles for sore throat.  Stop all antihistamines for now, and other non-prescription cough/cold preparations. May take Ibuprofen 200mg , 4 tabs every 8 hours with food for sore throat, fever, etc. May take Delsym Cough Suppressant at bedtime for nighttime cough.  Follow-up with family doctor if not improving 7 to 10 days.

## 2013-08-04 NOTE — ED Provider Notes (Signed)
CSN: [id number]     Arrival date & time 08/04/13  1731 History   First MD Initiated Contact with Patient 08/04/13 1835     Chief Complaint  Patient presents with  . Sore Throat      HPI Comments: Patient complains of soreness in her left neck when swallowing since yesterday.  She feels fatigued.  The history is provided by the patient.    Past Medical History  Diagnosis Date  . Fibromyalgia   . Chronic fatigue syndrome   . Hypercholesteremia   . Dysthymia   . Anxiety   . Hypotension   . Dermatillomania   . Idiopathic neuropathy   . Plantar fasciitis   . Cervical spondylosis   . Vertigo   . Obesity    Past Surgical History  Procedure Laterality Date  . Tonsillectomy    . Carpal tunnel release    . Tenosynovitis    . Abdominal hysterectomy    . Adenoidectomy    . Tubal ligation     Family History  Problem Relation Age of Onset  . Thyroid disease Mother   . Heart failure Father   . Diabetes Father   . Cancer Father    History  Substance Use Topics  . Smoking status: Never Smoker   . Smokeless tobacco: Never Used  . Alcohol Use: No   OB History   Grav Para Term Preterm Abortions TAB SAB Ect Mult Living                 Review of Systems + sore throat No cough No pleuritic pain No wheezing No nasal congestion No post-nasal drainage No sinus pain/pressure No itchy/red eyes No earache No hemoptysis No SOB No fever/chills No nausea No vomiting No abdominal pain No diarrhea No urinary symptoms No skin rash + fatigue No myalgias No headache    Allergies  Amoxicillin; Avelox; Clindamycin/lincomycin; Erythromycin; Flagyl; Macrobid; Macrolides and ketolides; Penicillins; and Sulfa antibiotics  Home Medications   Prior to Admission medications   Medication Sig Start Date End Date Taking? Authorizing Provider  clonazePAM (KLONOPIN) 0.5 MG tablet Take 0.5 mg by mouth at bedtime as needed.    Historical Provider, MD  cyclobenzaprine (FLEXERIL) 10 MG  tablet Take 1 tablet (10 mg total) by mouth 3 (three) times daily as needed for muscle spasms. 11/14/12   Marlaine HindJeffrey H Henderson, MD  diclofenac sodium (VOLTAREN) 1 % GEL Apply to affected area 3-4 times per day as needed, Disp 3 tubes 11/14/12   Marlaine HindJeffrey H Henderson, MD  doxycycline (VIBRA-TABS) 100 MG tablet Take 1 tablet (100 mg total) by mouth 2 (two) times daily. Take with food. 03/20/13   Lattie HawStephen A Beese, MD  fluticasone (FLONASE) 50 MCG/ACT nasal spray Place 2 sprays into the nose 2 (two) times daily.    Historical Provider, MD  Gabapentin (NEURONTIN PO) Take 1,200 mg by mouth.    Historical Provider, MD  ibuprofen (ADVIL,MOTRIN) 200 MG tablet Take three tablets ( 600 milligrams total) every 6 with food as needed for pain. 02/13/13   Lajean Manesavid Massey, MD  l-methylfolate-B6-B12 [facility](METANX) 3-35-2 MG TABS Take 1 tablet by mouth daily.    Historical Provider, MD  levothyroxine (SYNTHROID, LEVOTHROID) 50 MCG tablet Take 75 mcg by mouth daily.     Historical Provider, MD  meloxicam (MOBIC) 15 MG tablet Take 1 tablet (15 mg total) by mouth daily. 11/14/12   Marlaine HindJeffrey H Henderson, MD  sertraline (ZOLOFT) 100 MG tablet Take 100 mg by mouth daily.  Historical Provider, MD  Vitamin D, Ergocalciferol, (DRISDOL) 50000 UNITS CAPS Take 5,000 Units by mouth.    Historical Provider, MD   BP 124/77  Pulse 76  Temp(Src) 98.3 F (36.8 C) (Oral)  Resp 14  Wt 262 lb (118.842 kg)  SpO2 99% Physical Exam Nursing notes and Vital Signs reviewed. Appearance:  Patient appears stated age, and in no acute distress.  Patient is obese. Eyes:  Pupils are equal, round, and reactive to light and accomodation.  Extraocular movement is intact.  Conjunctivae are not inflamed  Ears:  Canals normal.  Tympanic membranes normal.  Nose:  Mildly congested turbinates.  No sinus tenderness.    Pharynx:   Mild erythema Neck:  Supple.   Tender enlarged posterior nodes are palpated bilaterally  Lungs:  Clear to auscultation.  Breath sounds are  equal.  Heart:  Regular rate and rhythm without murmurs, rubs, or gallops.  Abdomen:  Nontender without masses or hepatosplenomegaly.  Bowel sounds are present.  No CVA or flank tenderness.  Extremities:  No edema.  No calf tenderness Skin:  No rash present.   ED Course  Procedures  none    Labs Reviewed  STREP A DNA PROBE       Results for orders placed during the hospital encounter of 08/04/13  POCT RAPID STREP A (OFFICE)      Result Value Ref Range   Rapid Strep A Screen Negative  Negative   Imaging Review    MDM   1. Acute pharyngitis; suspect early viral URI    Throat culture pending There is no evidence of bacterial infection today.    If cold-like symptoms develop: Take plain Mucinex (1200 mg guaifenesin) twice daily for cough and congestion.  May add Sudafed for sinus congestion.   Increase fluid intake, rest. May use Afrin nasal spray (or generic oxymetazoline) twice daily for about 5 days.  Also recommend using saline nasal spray several times daily and saline nasal irrigation (AYR is a common brand) Try warm salt water gargles for sore throat.  Stop all antihistamines for now, and other non-prescription cough/cold preparations. May take Ibuprofen 200mg , 4 tabs every 8 hours with food for sore throat, fever, etc. May take Delsym Cough Suppressant at bedtime for nighttime cough.  Follow-up with family doctor if not improving 7 to 10 days.     Lattie HawStephen A Beese, MD 08/07/13 2232

## 2013-08-05 LAB — STREP A DNA PROBE: GASP: NEGATIVE

## 2013-08-11 ENCOUNTER — Telehealth: Payer: Self-pay | Admitting: *Deleted

## 2013-12-12 ENCOUNTER — Encounter: Payer: Self-pay | Admitting: Emergency Medicine

## 2013-12-12 ENCOUNTER — Emergency Department
Admission: EM | Admit: 2013-12-12 | Discharge: 2013-12-12 | Disposition: A | Discharge status: Home / Self Care | Payer: BC Managed Care – PPO | Source: Home / Self Care | Attending: Family Medicine | Admitting: Family Medicine

## 2013-12-12 DIAGNOSIS — L0100 Impetigo, unspecified: Secondary | ICD-10-CM

## 2013-12-12 MED ORDER — MUPIROCIN 2 % EX OINT: 1.0000 "application " | TOPICAL_OINTMENT | Freq: Two times a day (BID) | CUTANEOUS | Status: DC

## 2013-12-12 MED ORDER — DOXYCYCLINE HYCLATE 100 MG PO TABS
100.0000 mg | ORAL_TABLET | Freq: Two times a day (BID) | ORAL | Status: DC
Start: 2013-12-12 — End: 2014-05-10

## 2013-12-12 NOTE — Discharge Instructions (Signed)
Thank you for coming in today. ° ° °Folliculitis  °Folliculitis is redness, soreness, and swelling (inflammation) of the hair follicles. This condition can occur anywhere on the body. People with weakened immune systems, diabetes, or obesity have a greater risk of getting folliculitis. °CAUSES °· Bacterial infection. This is the most common cause. °· Fungal infection. °· Viral infection. °· Contact with certain chemicals, especially oils and tars. °Long-term folliculitis can result from bacteria that live in the nostrils. The bacteria may trigger multiple outbreaks of folliculitis over time. °SYMPTOMS °Folliculitis most commonly occurs on the scalp, thighs, legs, back, buttocks, and areas where hair is shaved frequently. An early sign of folliculitis is a small, white or yellow, pus-filled, itchy lesion (pustule). These lesions appear on a red, inflamed follicle. They are usually less than 0.2 inches (5 mm) wide. When there is an infection of the follicle that goes deeper, it becomes a boil or furuncle. A group of closely packed boils creates a larger lesion (carbuncle). Carbuncles tend to occur in hairy, sweaty areas of the body. °DIAGNOSIS  °Your caregiver can usually tell what is wrong by doing a physical exam. A sample may be taken from one of the lesions and tested in a lab. This can help determine what is causing your folliculitis. °TREATMENT  °Treatment may include: °· Applying warm compresses to the affected areas. °· Taking antibiotic medicines orally or applying them to the skin. °· Draining the lesions if they contain a large amount of pus or fluid. °· Laser hair removal for cases of long-lasting folliculitis. This helps to prevent regrowth of the hair. °HOME CARE INSTRUCTIONS °· Apply warm compresses to the affected areas as directed by your caregiver. °· If antibiotics are prescribed, take them as directed. Finish them even if you start to feel better. °· You may take over-the-counter medicines to  relieve itching. °· Do not shave irritated skin. °· Follow up with your caregiver as directed. °SEEK IMMEDIATE MEDICAL CARE IF:  °· You have increasing redness, swelling, or pain in the affected area. °· You have a fever. °MAKE SURE YOU: °· Understand these instructions. °· Will watch your condition. °· Will get help right away if you are not doing well or get worse. °Document Released: 06/15/2001 Document Revised: 10/06/2011 Document Reviewed: 07/07/2011 °ExitCare® Patient Information ©2015 [employer]. This information is not intended to replace advice given to you by your health care provider. Make sure you discuss any questions you have with your health care provider. ° °

## 2013-12-12 NOTE — ED Notes (Signed)
Pt c/o rash on her scalp x 5 days. She reports a history of Impetigo.

## 2013-12-12 NOTE — ED Provider Notes (Signed)
Barbara Wall is a 53 y.o. female who presents to Urgent Care today for rash. Patient developed an itchy rash on her forehead starting about 5-6 days ago. This is consistent with previous episodes of impetigo. She's tried some cortisone cream which has not helped much. No fevers or chills nausea vomiting or diarrhea.   Past Medical History  Diagnosis Date  . Fibromyalgia   . Chronic fatigue syndrome   . Hypercholesteremia   . Dysthymia   . Anxiety   . Hypotension   . Dermatillomania   . Idiopathic neuropathy   . Plantar fasciitis   . Cervical spondylosis   . Vertigo   . Obesity    History  Substance Use Topics  . Smoking status: Never Smoker   . Smokeless tobacco: Never Used  . Alcohol Use: No   ROS as above Medications: No current facility-administered medications for this encounter.   Current Outpatient Prescriptions  Medication Sig Dispense Refill  . clonazePAM (KLONOPIN) 0.5 MG tablet Take 0.5 mg by mouth at bedtime as needed.      . doxycycline (VIBRA-TABS) 100 MG tablet Take 1 tablet (100 mg total) by mouth 2 (two) times daily.  14 tablet  0  . fluticasone (FLONASE) 50 MCG/ACT nasal spray Place 2 sprays into the nose 2 (two) times daily.      . Gabapentin (NEURONTIN PO) Take 1,200 mg by mouth.      Marland Kitchen ibuprofen (ADVIL,MOTRIN) 200 MG tablet Take three tablets ( 600 milligrams total) every 6 with food as needed for pain.  30 tablet  0  . l-methylfolate-B6-B12 (METANX) 3-35-2 MG TABS Take 1 tablet by mouth daily.      Marland Kitchen levothyroxine (SYNTHROID, LEVOTHROID) 50 MCG tablet Take 75 mcg by mouth daily.       . meloxicam (MOBIC) 15 MG tablet Take 1 tablet (15 mg total) by mouth daily.  21 tablet  0  . mupirocin ointment (BACTROBAN) 2 % Apply 1 application topically 2 (two) times daily.  30 g  0  . sertraline (ZOLOFT) 100 MG tablet Take 100 mg by mouth daily.      . Vitamin D, Ergocalciferol, (DRISDOL) 50000 UNITS CAPS Take 5,000 Units by mouth.        Exam:  BP 120/83   Pulse 76  Temp(Src) 98.3 F (36.8 C) (Oral)  Resp 16  Ht  (1.651 m)  Wt 242 lb (109.77 kg)  BMI 40.27 kg/m2  SpO2 98% Gen: Well NAD Skin: Erythematous scaly patch right forehead extending into the scalp. Nontender.  No results found for this or any previous visit (from the past 24 hour(s)). No results found.  Assessment and Plan: 53 y.o. female with impetigo versus folliculitis most likely explanation. Tinea capitis is also a possibility. Plan for treatment with mupirocin and doxycycline. If not improved would consider followup with PCP for KOH.   Discussed warning signs or symptoms. Please see discharge instructions. Patient expresses understanding.   This note was created using Conservation officer, historic buildings. Any transcription errors are unintended.    Rodolph Bong, MD 12/12/13 [phone]

## 2014-05-10 ENCOUNTER — Encounter: Payer: Self-pay | Admitting: Emergency Medicine

## 2014-05-10 ENCOUNTER — Emergency Department
Admission: EM | Admit: 2014-05-10 | Discharge: 2014-05-10 | Disposition: A | Discharge status: Home / Self Care | Payer: BLUE CROSS/BLUE SHIELD | Source: Home / Self Care | Attending: Family Medicine | Admitting: Family Medicine

## 2014-05-10 DIAGNOSIS — J029 Acute pharyngitis, unspecified: Secondary | ICD-10-CM

## 2014-05-10 LAB — POCT RAPID STREP A (OFFICE): Rapid Strep A Screen: NEGATIVE

## 2014-05-10 MED ORDER — PREDNISONE 10 MG PO TABS: 30.0000 mg | ORAL_TABLET | Freq: Every day | ORAL | Status: DC

## 2014-05-10 MED ORDER — IPRATROPIUM BROMIDE 0.06 % NA SOLN: 2.0000 | Freq: Four times a day (QID) | NASAL | Status: DC

## 2014-05-10 NOTE — ED Provider Notes (Signed)
Barbara BurnsMelissa Wall is a 54 y.o. female who presents to Urgent Care today for sore throat headache. Symptoms started yesterday. Sore throat is moderate and worse with swallowing. No trouble breathing cough congestion vomiting or diarrhea. No medications tried yet.   Past Medical History  Diagnosis Date  . Fibromyalgia   . Chronic fatigue syndrome   . Hypercholesteremia   . Dysthymia   . Anxiety   . Hypotension   . Dermatillomania   . Idiopathic neuropathy   . Plantar fasciitis   . Cervical spondylosis   . Vertigo   . Obesity    Past Surgical History  Procedure Laterality Date  . Tonsillectomy    . Carpal tunnel release    . Tenosynovitis    . Abdominal hysterectomy    . Adenoidectomy    . Tubal ligation     History  Substance Use Topics  . Smoking status: Never Smoker   . Smokeless tobacco: Never Used  . Alcohol Use: No   ROS as above Medications: No current facility-administered medications for this encounter.   Current Outpatient Prescriptions  Medication Sig Dispense Refill  . clonazePAM (KLONOPIN) 0.5 MG tablet Take 0.5 mg by mouth at bedtime as needed.    . fluticasone (FLONASE) 50 MCG/ACT nasal spray Place 2 sprays into the nose 2 (two) times daily.    . Gabapentin (NEURONTIN PO) Take 1,200 mg by mouth.    Marland Kitchen. ibuprofen (ADVIL,MOTRIN) 200 MG tablet Take three tablets ( 600 milligrams total) every 6 with food as needed for pain. 30 tablet 0  . ipratropium (ATROVENT) 0.06 % nasal spray Place 2 sprays into both nostrils 4 (four) times daily. 15 mL 1  . l-methylfolate-B6-B12 (METANX) 3-35-2 MG TABS Take 1 tablet by mouth daily.    Marland Kitchen. levothyroxine (SYNTHROID, LEVOTHROID) 50 MCG tablet Take 75 mcg by mouth daily.     . meloxicam (MOBIC) 15 MG tablet Take 1 tablet (15 mg total) by mouth daily. 21 tablet 0  . mupirocin ointment (BACTROBAN) 2 % Apply 1 application topically 2 (two) times daily. 30 g 0  . predniSONE (DELTASONE) 10 MG tablet Take 3 tablets (30 mg total) by mouth  daily. 15 tablet 0  . sertraline (ZOLOFT) 100 MG tablet Take 100 mg by mouth daily.    . Vitamin D, Ergocalciferol, (DRISDOL) 50000 UNITS CAPS Take 5,000 Units by mouth.     Allergies  Allergen Reactions  . Amoxicillin   . Avelox [Moxifloxacin Hcl In Nacl]   . Clindamycin/Lincomycin   . Erythromycin   . Flagyl [Metronidazole Hcl]   . Macrobid [Nitrofurantoin Macrocrystal]   . Macrolides And Ketolides   . Penicillins   . Sulfa Antibiotics      Exam:  BP 120/74 mmHg  Pulse 89  Temp(Src) 98.6 F (37 C) (Oral)  Ht 5\' 5"  (1.651 m)  Wt 256 lb (116.121 kg)  BMI 42.60 kg/m2  SpO2 95% Gen: Well NAD HEENT: EOMI,  MMM posterior pharynx is mildly erythematous with moderate cobblestoning. Normal tympanic membranes bilaterally. Lungs: Normal work of breathing. CTABL Heart: RRR no MRG Abd: NABS, Soft. Nondistended, Nontender Exts: Brisk capillary refill, warm and well perfused.   Point of care rapid strep test was negative. Throat culture pending.   Assessment and Plan: 54 y.o. female with viral pharyngitis likely. Culture pending. Treat with prednisone and Atrovent nasal spray.  Discussed warning signs or symptoms. Please see discharge instructions. Patient expresses understanding.     Rodolph BongEvan S Shianna Bally, MD 05/10/14 1022

## 2014-05-10 NOTE — Discharge Instructions (Signed)
Thank you for coming in today. °Call or go to the emergency room if you get worse, have trouble breathing, have chest pains, or palpitations.  ° °Pharyngitis °Pharyngitis is redness, pain, and swelling (inflammation) of your pharynx.  °CAUSES  °Pharyngitis is usually caused by infection. Most of the time, these infections are from viruses (viral) and are part of a cold. However, sometimes pharyngitis is caused by bacteria (bacterial). Pharyngitis can also be caused by allergies. Viral pharyngitis may be spread from person to person by coughing, sneezing, and personal items or utensils (cups, forks, spoons, toothbrushes). Bacterial pharyngitis may be spread from person to person by more intimate contact, such as kissing.  °SIGNS AND SYMPTOMS  °Symptoms of pharyngitis include:   °· Sore throat.   °· Tiredness (fatigue).   °· Low-grade fever.   °· Headache. °· Joint pain and muscle aches. °· Skin rashes. °· Swollen lymph nodes. °· Plaque-like film on throat or tonsils (often seen with bacterial pharyngitis). °DIAGNOSIS  °Your health care provider will ask you questions about your illness and your symptoms. Your medical history, along with a physical exam, is often all that is needed to diagnose pharyngitis. Sometimes, a rapid strep test is done. Other lab tests may also be done, depending on the suspected cause.  °TREATMENT  °Viral pharyngitis will usually get better in 3-4 days without the use of medicine. Bacterial pharyngitis is treated with medicines that kill germs (antibiotics).  °HOME CARE INSTRUCTIONS  °· Drink enough water and fluids to keep your urine clear or pale yellow.   °· Only take over-the-counter or prescription medicines as directed by your health care provider:   °¨ If you are prescribed antibiotics, make sure you finish them even if you start to feel better.   °¨ Do not take aspirin.   °· Get lots of rest.   °· Gargle with 8 oz of salt water (½ tsp of salt per 1 qt of water) as often as every 1-2  hours to soothe your throat.   °· Throat lozenges (if you are not at risk for choking) or sprays may be used to soothe your throat. °SEEK MEDICAL CARE IF:  °· You have large, tender lumps in your neck. °· You have a rash. °· You cough up green, yellow-brown, or bloody spit. °SEEK IMMEDIATE MEDICAL CARE IF:  °· Your neck becomes stiff. °· You drool or are unable to swallow liquids. °· You vomit or are unable to keep medicines or liquids down. °· You have severe pain that does not go away with the use of recommended medicines. °· You have trouble breathing (not caused by a stuffy nose). °MAKE SURE YOU:  °· Understand these instructions. °· Will watch your condition. °· Will get help right away if you are not doing well or get worse. °Document Released: 04/06/2005 Document Revised: 01/25/2013 Document Reviewed: 12/12/2012 °ExitCare® Patient Information ©2015 [employer]. This information is not intended to replace advice given to you by your health care provider. Make sure you discuss any questions you have with your health care provider. ° ° ° °

## 2014-05-10 NOTE — ED Notes (Signed)
Sore throat and headache started yesterday.

## 2014-05-11 LAB — STREP A DNA PROBE: GASP: POSITIVE

## 2014-05-14 ENCOUNTER — Telehealth (HOSPITAL_COMMUNITY): Payer: Self-pay | Admitting: Family Medicine

## 2014-05-14 MED ORDER — CEPHALEXIN 500 MG PO CAPS: 500.0000 mg | ORAL_CAPSULE | Freq: Three times a day (TID) | ORAL | Status: DC

## 2014-05-14 NOTE — ED Notes (Signed)
Throat culture positive for strep throat. Keflex called in. Patient is allergic to penicillin and clindamycin but can tolerate Keflex.  Rodolph BongEvan S Corey, MD 05/14/14 [phone]

## 2014-09-05 DIAGNOSIS — M47812 Spondylosis without myelopathy or radiculopathy, cervical region: Secondary | ICD-10-CM | POA: Insufficient documentation

## 2014-09-15 ENCOUNTER — Encounter: Payer: Self-pay | Admitting: Emergency Medicine

## 2014-09-15 ENCOUNTER — Emergency Department
Admission: EM | Admit: 2014-09-15 | Discharge: 2014-09-15 | Disposition: A | Discharge status: Home / Self Care | Payer: Federal, State, Local not specified - PPO | Source: Home / Self Care | Attending: Emergency Medicine | Admitting: Emergency Medicine

## 2014-09-15 DIAGNOSIS — J0100 Acute maxillary sinusitis, unspecified: Secondary | ICD-10-CM | POA: Diagnosis not present

## 2014-09-15 DIAGNOSIS — J029 Acute pharyngitis, unspecified: Secondary | ICD-10-CM | POA: Diagnosis not present

## 2014-09-15 MED ORDER — AZITHROMYCIN 250 MG PO TABS: ORAL_TABLET | ORAL | Status: DC

## 2014-09-15 MED ORDER — FLUCONAZOLE 150 MG PO TABS: ORAL_TABLET | ORAL | Status: DC

## 2014-09-15 NOTE — ED Notes (Signed)
Pt c/o facial pain, HA, post nasal drip and scratchy throat since yesterday.

## 2014-09-15 NOTE — ED Provider Notes (Signed)
CSN: [id number]     Arrival date & time 09/15/14  1140 History   First MD Initiated Contact with Patient 09/15/14 1205     Chief Complaint  Patient presents with  . Facial Pain   (Consider location/radiation/quality/duration/timing/severity/associated sxs/prior Treatment) HPI Several days sore scratchy throat, sinus congestion progressively worsening maxillary sinus pain with yellow-green rhinorrhea. Low-grade fever. No nausea or vomiting. No chest pain or shortness of breath or cough. Past Medical History  Diagnosis Date  . Fibromyalgia   . Chronic fatigue syndrome   . Hypercholesteremia   . Dysthymia   . Anxiety   . Hypotension   . Dermatillomania   . Idiopathic neuropathy   . Plantar fasciitis   . Cervical spondylosis   . Vertigo   . Obesity    Past Surgical History  Procedure Laterality Date  . Tonsillectomy    . Carpal tunnel release    . Tenosynovitis    . Abdominal hysterectomy    . Adenoidectomy    . Tubal ligation     Family History  Problem Relation Age of Onset  . Thyroid disease Mother   . Heart failure Father   . Diabetes Father   . Cancer Father    History  Substance Use Topics  . Smoking status: Never Smoker   . Smokeless tobacco: Never Used  . Alcohol Use: No   OB History    No data available     Review of Systems Remainder of Review of Systems negative for acute change except as noted in the HPI.  Allergies  Amoxicillin; Avelox; Clindamycin/lincomycin; Erythromycin; Flagyl; Macrobid; Macrolides and ketolides; Penicillins; and Sulfa antibiotics  Home Medications   Prior to Admission medications   Medication Sig Start Date End Date Taking? Authorizing Provider  azithromycin (ZITHROMAX Z-PAK) 250 MG tablet Take 2 tablets on day one, then 1 tablet daily on days 2 through 5 09/15/14   Lajean Manesavid Renatha Rosen, MD  cephALEXin (KEFLEX) 500 MG capsule Take 1 capsule (500 mg total) by mouth 3 (three) times daily. 05/14/14   Rodolph BongEvan S Corey, MD  clonazePAM  (KLONOPIN) 0.5 MG tablet Take 0.5 mg by mouth at bedtime as needed.    Historical Provider, MD  fluconazole (DIFLUCAN) 150 MG tablet For yeast infection, Take 1 now, then may repeat x1 in 4 days. 09/15/14   Lajean Manesavid Brenon Antosh, MD  fluticasone [facility](FLONASE) 50 MCG/ACT nasal spray Place 2 sprays into the nose 2 (two) times daily.    Historical Provider, MD  Gabapentin (NEURONTIN PO) Take 1,200 mg by mouth.    Historical Provider, MD  ibuprofen (ADVIL,MOTRIN) 200 MG tablet Take three tablets ( 600 milligrams total) every 6 with food as needed for pain. 02/13/13   Lajean Manesavid Alexander Mcauley, MD  ipratropium (ATROVENT) 0.06 % nasal spray Place 2 sprays into both nostrils 4 (four) times daily. 05/10/14   Rodolph BongEvan S Corey, MD  l-methylfolate-B6-B12 (METANX) 3-35-2 MG TABS Take 1 tablet by mouth daily.    Historical Provider, MD  levothyroxine (SYNTHROID, LEVOTHROID) 50 MCG tablet Take 75 mcg by mouth daily.     Historical Provider, MD  meloxicam (MOBIC) 15 MG tablet Take 1 tablet (15 mg total) by mouth daily. 11/14/12   Marlaine HindJeffrey H Henderson, MD  mupirocin ointment (BACTROBAN) 2 % Apply 1 application topically 2 (two) times daily. 12/12/13   Rodolph BongEvan S Corey, MD  predniSONE (DELTASONE) 10 MG tablet Take 3 tablets (30 mg total) by mouth daily. 05/10/14   Rodolph BongEvan S Corey, MD  sertraline (ZOLOFT) 100 MG tablet  Take 100 mg by mouth daily.    Historical Provider, MD  Vitamin D, Ergocalciferol, (DRISDOL) 50000 UNITS CAPS Take 5,000 Units by mouth.    Historical Provider, MD   BP 124/76 mmHg  Pulse 79  Temp(Src) 98.1 F (36.7 C) (Oral)  SpO2 97% Physical Exam  Constitutional: She is oriented to person, place, and time. She appears well-developed and well-nourished. No distress.  HENT:  Head: Normocephalic and atraumatic.  Right Ear: Tympanic membrane, external ear and ear canal normal.  Left Ear: Tympanic membrane, external ear and ear canal normal.  Nose: Mucosal edema and rhinorrhea present. Right sinus exhibits maxillary sinus tenderness. Left  sinus exhibits maxillary sinus tenderness.  Mouth/Throat: No oral lesions. No oropharyngeal exudate.  Pharynx normal except mildly red, injected with serous mucoid postnasal drainage. Airway intact  Eyes: Conjunctivae are normal. Right eye exhibits no discharge. Left eye exhibits no discharge. No scleral icterus.  Neck: Neck supple.  Cardiovascular: Normal rate, regular rhythm and normal heart sounds.   Pulmonary/Chest: Effort normal and breath sounds normal. She has no wheezes. She has no rales.  Lymphadenopathy:    She has no cervical adenopathy.  Neurological: She is alert and oriented to person, place, and time.  Skin: Skin is warm and dry.  Nursing note and vitals reviewed.   ED Course  Procedures (including critical care time) Labs Review Labs Reviewed - No data to display  Imaging Review No results found.   MDM   1. Acute pharyngitis, unspecified pharyngitis type   2. Acute maxillary sinusitis, recurrence not specified      Treatment options discussed, as well as risks, benefits, alternatives. Patient voiced understanding and agreement with the following plans:  New Prescriptions   AZITHROMYCIN (ZITHROMAX Z-PAK) 250 MG TABLET    Take 2 tablets on day one, then 1 tablet daily on days 2 through 5   FLUCONAZOLE (DIFLUCAN) 150 MG TABLET    For yeast infection, Take 1 now, then may repeat x1 in 4 days.   Flonase and other symptomatic care discussed. Fluconazole prescribed at her request in case she gets a yeast infection from antibiotic Follow-up with your primary care doctor in 5-7 days if not improving, or sooner if symptoms become worse. Precautions discussed. Red flags discussed. Questions invited and answered. Patient voiced understanding and agreement.   Lajean Manes, MD 09/15/14 [phone]

## 2015-02-23 ENCOUNTER — Emergency Department
Admission: EM | Admit: 2015-02-23 | Discharge: 2015-02-23 | Disposition: A | Discharge status: Home / Self Care | Payer: Federal, State, Local not specified - PPO | Source: Home / Self Care | Attending: Family Medicine | Admitting: Family Medicine

## 2015-02-23 ENCOUNTER — Other Ambulatory Visit: Payer: Self-pay | Admitting: Family Medicine

## 2015-02-23 ENCOUNTER — Encounter: Payer: Self-pay | Admitting: *Deleted

## 2015-02-23 DIAGNOSIS — M65331 Trigger finger, right middle finger: Secondary | ICD-10-CM | POA: Diagnosis not present

## 2015-02-23 DIAGNOSIS — J029 Acute pharyngitis, unspecified: Secondary | ICD-10-CM

## 2015-02-23 LAB — POCT RAPID STREP A (OFFICE): Rapid Strep A Screen: NEGATIVE

## 2015-02-23 MED ORDER — DICLOFENAC SODIUM 1 % TD GEL
1.0000 | Freq: Three times a day (TID) | TRANSDERMAL | Status: DC
Start: 2015-02-23 — End: 2017-05-24

## 2015-02-23 NOTE — Discharge Instructions (Signed)
If cold symptoms develop, try the following: Take plain guaifenesin (1200mg  extended release tabs such as Mucinex) twice daily, with plenty of water, for cough and congestion.  May add Pseudoephedrine (30mg , one or two every 4 to 6 hours) for sinus congestion.  Get adequate rest.   May use Afrin nasal spray (or generic oxymetazoline) twice daily for about 5 days and then discontinue.  Also recommend using saline nasal spray several times daily and saline nasal irrigation (AYR is a common brand).  May take Delsym Cough Suppressant at bedtime for nighttime cough.  Try warm salt water gargles for sore throat.  Stop all antihistamines for now, and other non-prescription cough/cold preparations.  Follow-up with family doctor if not improving about10 days.    Trigger Finger Trigger finger (digital tendinitis and stenosing tenosynovitis) is a common disorder that causes an often painful catching of the fingers or thumb. It occurs as a clicking, snapping, or locking of a finger in the palm of the hand. This is caused by a problem with the tendons that flex or bend the fingers sliding smoothly through their sheaths. The condition may occur in any finger or a couple fingers at the same time.  The finger may lock with the finger curled or suddenly straighten out with a snap. This is more common in patients with rheumatoid arthritis and diabetes. Left untreated, the condition may get worse to the point where the finger becomes locked in flexion, like making a fist, or less commonly locked with the finger straightened out. CAUSES   Inflammation and scarring that lead to swelling around the tendon sheath.  Repeated or forceful movements.  Rheumatoid arthritis, an autoimmune disease that affects joints.  Gout.  Diabetes mellitus. SIGNS AND SYMPTOMS  Soreness and swelling of your finger.  A painful clicking or snapping as you bend and straighten your finger. DIAGNOSIS  Your health care provider will do  a physical exam of your finger to diagnose trigger finger. TREATMENT   Splinting for 6-8 weeks may be helpful.  Nonsteroidal anti-inflammatory medicines (NSAIDs) can help to relieve the pain and inflammation.  Cortisone injections, along with splinting, may speed up recovery. Several injections may be required. Cortisone may give relief after one injection.  Surgery is another treatment that may be used if conservative treatments do not work. Surgery can be minor, without incisions (a cut does not have to be made), and can be done with a needle through the skin.  Other surgical choices involve an open procedure in which the surgeon opens the hand through a small incision and cuts the pulley so the tendon can again slide smoothly. Your hand will still work fine. HOME CARE INSTRUCTIONS  Apply ice to the injured area, twice per day:  Put ice in a plastic bag.  Place a towel between your skin and the bag.  Leave the ice on for 20 minutes, 3-4 times a day.  Rest your hand often. MAKE SURE YOU:   Understand these instructions.  Will watch your condition.  Will get help right away if you are not doing well or get worse.   This information is not intended to replace advice given to you by your health care provider. Make sure you discuss any questions you have with your health care provider.   Document Released: 01/25/2004 Document Revised: 12/07/2012 Document Reviewed: 09/06/2012 Elsevier Interactive Patient Education [employer]2016 [employer]

## 2015-02-23 NOTE — ED Notes (Signed)
Pt states she has pain 4/10 on the back of her tongue almost like a burnt feeling.   Started 2 days ago and she states it felt similar to thrush, has not been on abx.

## 2015-02-23 NOTE — ED Provider Notes (Signed)
CSN: [id number]     Arrival date & time 02/23/15  [id number] History   First MD Initiated Contact with Patient 02/23/15 1023     Chief Complaint  Patient presents with  . Oral Pain    tongue      HPI Comments: Patient presents with two complaints: 1)  During the past two days she has had a burning sensation on the very back of her tongue, and today she had a very mild sore throat.  She also developed mild cough today. 2)  She also complains of a painful clicking sensation when extending her right third finger, and the DIP joint seems to lock when she actively extends.  The history is provided by the patient.    Past Medical History  Diagnosis Date  . Fibromyalgia   . Chronic fatigue syndrome   . Hypercholesteremia   . Dysthymia   . Anxiety   . Hypotension   . Dermatillomania   . Idiopathic neuropathy   . Plantar fasciitis   . Cervical spondylosis   . Vertigo   . Obesity    Past Surgical History  Procedure Laterality Date  . Tonsillectomy    . Carpal tunnel release    . Tenosynovitis    . Abdominal hysterectomy    . Adenoidectomy    . Tubal ligation     Family History  Problem Relation Age of Onset  . Thyroid disease Mother   . Heart failure Father   . Diabetes Father   . Cancer Father    Social History  Substance Use Topics  . Smoking status: Never Smoker   . Smokeless tobacco: Never Used  . Alcohol Use: No   OB History    No data available     Review of Systems + mild sore throat + sore tongue + cough No pleuritic pain No wheezing No nasal congestion No  post-nasal drainage No sinus pain/pressure No itchy/red eyes No earache No hemoptysis No SOB No fever/chills No nausea No vomiting No abdominal pain No diarrhea No urinary symptoms No skin rash + fatigue No myalgias No headache + pain/clicking in right third finger  Allergies  Amoxicillin; Avelox; Clindamycin/lincomycin; Erythromycin; Flagyl; Macrobid; Macrolides and ketolides; Penicillins;  and Sulfa antibiotics  Home Medications   Prior to Admission medications   Medication Sig Start Date End Date Taking? Authorizing Provider  azithromycin (ZITHROMAX Z-PAK) 250 MG tablet Take 2 tablets on day one, then 1 tablet daily on days 2 through 5 09/15/14   Lajean Manesavid Massey, MD  cephALEXin (KEFLEX) 500 MG capsule Take 1 capsule (500 mg total) by mouth 3 (three) times daily. 05/14/14   Rodolph BongEvan S Corey, MD  clonazePAM (KLONOPIN) 0.5 MG tablet Take 0.5 mg by mouth at bedtime as needed.    Historical Provider, MD  diclofenac sodium (VOLTAREN) 1 % GEL Apply 1 application topically 3 (three) times daily. Apply 1 gm each application 02/23/15   Lattie HawStephen A Beese, MD  fluconazole (DIFLUCAN) 150 MG tablet For yeast infection, Take 1 now, then may repeat x1 in 4 days. 09/15/14   Lajean Manesavid Massey, MD  fluticasone [facility](FLONASE) 50 MCG/ACT nasal spray Place 2 sprays into the nose 2 (two) times daily.    Historical Provider, MD  Gabapentin (NEURONTIN PO) Take 1,200 mg by mouth.    Historical Provider, MD  ibuprofen (ADVIL,MOTRIN) 200 MG tablet Take three tablets ( 600 milligrams total) every 6 with food as needed for pain. 02/13/13   Lajean Manesavid Massey, MD  ipratropium (ATROVENT) 0.06 %  nasal spray Place 2 sprays into both nostrils 4 (four) times daily. 05/10/14   Rodolph Bong, MD  l-methylfolate-B6-B12 (METANX) 3-35-2 MG TABS Take 1 tablet by mouth daily.    Historical Provider, MD  levothyroxine (SYNTHROID, LEVOTHROID) 50 MCG tablet Take 75 mcg by mouth daily.     Historical Provider, MD  meloxicam (MOBIC) 15 MG tablet Take 1 tablet (15 mg total) by mouth daily. 11/14/12   Marlaine Hind, MD  mupirocin ointment (BACTROBAN) 2 % Apply 1 application topically 2 (two) times daily. 12/12/13   Rodolph Bong, MD  predniSONE (DELTASONE) 10 MG tablet Take 3 tablets (30 mg total) by mouth daily. 05/10/14   Rodolph Bong, MD  sertraline (ZOLOFT) 100 MG tablet Take 100 mg by mouth daily.    Historical Provider, MD  Vitamin D, Ergocalciferol,  (DRISDOL) 50000 UNITS CAPS Take 5,000 Units by mouth.    Historical Provider, MD   Meds Ordered and Administered this Visit  Medications - No data to display  BP 117/81 mmHg  Pulse 74  Temp(Src) 98.4 F (36.9 C) (Oral)  Ht  (1.651 m)  Wt 261 lb 8 oz (118.616 kg)  BMI 43.52 kg/m2  SpO2 96% No data found.   Physical Exam Nursing notes and Vital Signs reviewed. Appearance:  Patient appears stated age, and in no acute distress.  Patient is obese (BMI 43.5) Eyes:  Pupils are equal, round, and reactive to light and accomodation.  Extraocular movement is intact.  Conjunctivae are not inflamed  Ears:  Canals normal.  Tympanic membranes normal.  Nose:  Mildly congested turbinates.  No sinus tenderness.    Pharynx:  Minimal erythema Neck:  Supple.  Tender enlarged posterior nodes are palpated bilaterally  Lungs:  Clear to auscultation.  Breath sounds are equal.  Moving air well. Heart:  Regular rate and rhythm without murmurs, rubs, or gallops.  Abdomen:  Nontender without masses or hepatosplenomegaly.  Bowel sounds are present.  No CVA or flank tenderness.  Extremities:  No edema.  No calf tenderness.  Left third finger:  Tenderness to palpation over the PIP joint without swelling.  Patient has mild pain and hesitation when extending the DIP joint. Skin:  No rash present.   ED Course  Procedures none    Labs Reviewed  STREP A DNA PROBE  POCT RAPID STREP A (OFFICE) negative     MDM   1. Trigger middle finger, right   2. Acute pharyngitis, unspecified etiology; suspect early viral URI   Rx for Voltaren gel. Continue finger splint. Other instructions given Followup with Dr. Rodney Langton or Dr. Clementeen [facility] ([facility]) if not improving about two weeks.   Throat culture pending. If cold symptoms develop, try the following: Take plain guaifenesin (  extended release tabs such as Mucinex) twice daily, with plenty of water, for cough and congestion.  May  add Pseudoephedrine ( , one or two every 4 to 6 hours) for sinus congestion.  Get adequate rest.   May use Afrin nasal spray (or generic oxymetazoline) twice daily for about 5 days and then discontinue.  Also recommend using saline nasal spray several times daily and saline nasal irrigation (AYR is a common brand).  May take Delsym Cough Suppressant at bedtime for nighttime cough.  Try warm salt water gargles for sore throat.  Stop all antihistamines for now, and other non-prescription cough/cold preparations.  Follow-up with family doctor if not improving about10 days.   Lattie Haw, MD 02/26/15  1812 

## 2015-02-24 ENCOUNTER — Telehealth: Payer: Self-pay | Admitting: Emergency Medicine

## 2015-02-24 LAB — STREP A DNA PROBE: GASP: NEGATIVE

## 2015-02-26 ENCOUNTER — Ambulatory Visit (INDEPENDENT_AMBULATORY_CARE_PROVIDER_SITE_OTHER): Payer: Federal, State, Local not specified - PPO | Admitting: Sports Medicine

## 2015-02-26 ENCOUNTER — Encounter: Payer: Self-pay | Admitting: Sports Medicine

## 2015-02-26 VITALS — BP 100/62 | HR 87 | Temp 98.5°F | Resp 18 | Wt 262.1 lb

## 2015-02-26 DIAGNOSIS — M65331 Trigger finger, right middle finger: Secondary | ICD-10-CM | POA: Insufficient documentation

## 2015-02-26 NOTE — Assessment & Plan Note (Signed)
Third flexor tendon sheath injection as above, out of work until Monday, return to see me in one month.

## 2015-02-26 NOTE — Progress Notes (Signed)
   Subjective:    I'm seeing this patient as a consultation for:  Dr. Cathren HarshBeese  CC: Trigger finger  HPI: This is a pleasant 54 year old female who comes in with a several week history of increasing pain over the palm of the right hand, moderate, persistent without radiation. She does get mechanical clicking and catching,  Past medical history, Surgical history, Family history not pertinant except as noted below, Social history, Allergies, and medications have been entered into the medical record, reviewed, and no changes needed.   Review of Systems: No headache, visual changes, nausea, vomiting, diarrhea, constipation, dizziness, abdominal pain, skin rash, fevers, chills, night sweats, weight loss, swollen lymph nodes, body aches, joint swelling, muscle aches, chest pain, shortness of breath, mood changes, visual or auditory hallucinations.   Objective:   General: Well Developed, well nourished, and in no acute distress.  Neuro/Psych: Alert and oriented x3, extra-ocular muscles intact, able to move all 4 extremities, sensation grossly intact. Skin: Warm and dry, no rashes noted.  Respiratory: Not using accessory muscles, speaking in full sentences, trachea midline.  Cardiovascular: Pulses palpable, no extremity edema. Abdomen: Does not appear distended. Right hand: Palpable nodule just proximal to the metacarpophalangeal joint fold of the right third finger.  Procedure: Real-time Ultrasound Guided Injection of right third flexor tendon sheath Device: GE Logiq E  Verbal informed consent obtained.  Time-out conducted.  Noted no overlying erythema, induration, or other signs of local infection.  Skin prepped in a sterile fashion.  Local anesthesia: Topical Ethyl chloride.  With sterile technique and under real time ultrasound guidance:  25-gauge needle advanced into the flexor tendon sheath and 1 mL kenalog 40, 1 mL lidocaine injected easily. Completed without difficulty  Pain immediately  resolved suggesting accurate placement of the medication.  Advised to call if fevers/chills, erythema, induration, drainage, or persistent bleeding.  Images permanently stored and available for review in the ultrasound unit.  Impression: Technically successful ultrasound guided injection.  Impression and Recommendations:   This case required medical decision making of moderate complexity.

## 2015-03-26 ENCOUNTER — Encounter: Payer: Self-pay | Admitting: Sports Medicine

## 2015-03-26 ENCOUNTER — Ambulatory Visit (INDEPENDENT_AMBULATORY_CARE_PROVIDER_SITE_OTHER): Payer: Federal, State, Local not specified - PPO | Admitting: Sports Medicine

## 2015-03-26 VITALS — BP 102/64 | HR 68 | Temp 98.2°F | Resp 18 | Wt 256.6 lb

## 2015-03-26 DIAGNOSIS — M65331 Trigger finger, right middle finger: Secondary | ICD-10-CM | POA: Diagnosis not present

## 2015-03-26 DIAGNOSIS — M545 Low back pain, unspecified: Secondary | ICD-10-CM

## 2015-03-26 DIAGNOSIS — M5440 Lumbago with sciatica, unspecified side: Secondary | ICD-10-CM | POA: Insufficient documentation

## 2015-03-26 NOTE — Assessment & Plan Note (Signed)
Resolved after injection 

## 2015-03-26 NOTE — Progress Notes (Signed)
  Subjective:    CC: Follow-up  HPI: Right third trigger finger: Completely resolved both pain and mechanical symptoms with flexor tendon sheath injection at the last visit.  Back pain: With radiation down the right leg to the knee, but with nothing past the knee, and no toe or foot paresthesias. She has seen another provider, and has had multiple x-rays and MRIs as well as physical therapy. No intervention was performed. She has also seen in neurosurgeon in the past. She is agreeable to get a copy of her MRI and a disc for my review.  Past medical history, Surgical history, Family history not pertinant except as noted below, Social history, Allergies, and medications have been entered into the medical record, reviewed, and no changes needed.   Review of Systems: No fevers, chills, night sweats, weight loss, chest pain, or shortness of breath.   Objective:    General: Well Developed, well nourished, and in no acute distress.  Neuro: Alert and oriented x3, extra-ocular muscles intact, sensation grossly intact.  HEENT: Normocephalic, atraumatic, pupils equal round reactive to light, neck supple, no masses, no lymphadenopathy, thyroid nonpalpable.  Skin: Warm and dry, no rashes. Cardiac: Regular rate and rhythm, no murmurs rubs or gallops, no lower extremity edema.  Respiratory: Clear to auscultation bilaterally. Not using accessory muscles, speaking in full sentences.  Impression and Recommendations:    I spent 25 minutes with this patient, greater than 50% was face-to-face time counseling regarding the above diagnoses

## 2015-03-26 NOTE — Assessment & Plan Note (Signed)
Symptoms do sound like facetogenic pain. SI joint can also present similarly. She has done physical therapy, and is doing meloxicam. I have advised that she discuss with her PCP regarding weight loss, and bring me her MRI so that we can look for interventional target.

## 2015-05-10 ENCOUNTER — Ambulatory Visit (INDEPENDENT_AMBULATORY_CARE_PROVIDER_SITE_OTHER): Payer: Federal, State, Local not specified - PPO | Admitting: Sports Medicine

## 2015-05-10 ENCOUNTER — Encounter: Payer: Self-pay | Admitting: Sports Medicine

## 2015-05-10 VITALS — BP 131/77 | HR 87 | Temp 98.9°F | Resp 18 | Wt 266.0 lb

## 2015-05-10 DIAGNOSIS — M545 Low back pain, unspecified: Secondary | ICD-10-CM

## 2015-05-10 DIAGNOSIS — E669 Obesity, unspecified: Secondary | ICD-10-CM | POA: Diagnosis not present

## 2015-05-10 MED ORDER — PHENTERMINE HCL 37.5 MG PO TABS: ORAL_TABLET | ORAL | Status: DC

## 2015-05-10 MED ORDER — MELOXICAM 15 MG PO TABS: ORAL_TABLET | ORAL | Status: DC

## 2015-05-10 NOTE — Assessment & Plan Note (Signed)
Phentermine, nutrition referral, return monthly for weight checks and refills.  We will probably start Saxenda or maintenance of weight loss at some point.

## 2015-05-10 NOTE — Progress Notes (Signed)
  Subjective:    CC: back pain  HPI: Barbara Wall returns, she has axial low back pain worse with standing, better with sitting. Radiation into the buttock and thigh but not past the knee. She has had physical therapy and is eager to restart the exercises, she brought an MRI disc for my review.  Obesity: Has tried dieting, exercise without much success, agreeable to start weight loss treatment with me.  Past medical history, Surgical history, Family history not pertinant except as noted below, Social history, Allergies, and medications have been entered into the medical record, reviewed, and no changes needed.   Review of Systems: No fevers, chills, night sweats, weight loss, chest pain, or shortness of breath.   Objective:    General: Well Developed, well nourished, and in no acute distress.  Neuro: Alert and oriented x3, extra-ocular muscles intact, sensation grossly intact.  HEENT: Normocephalic, atraumatic, pupils equal round reactive to light, neck supple, no masses, no lymphadenopathy, thyroid nonpalpable.  Skin: Warm and dry, no rashes. Cardiac: Regular rate and rhythm, no murmurs rubs or gallops, no lower extremity edema.  Respiratory: Clear to auscultation bilaterally. Not using accessory muscles, speaking in full sentences.  MRI reviewed, there is only minimal discogenic changes however she does have L4-L5 and L5-S1 bilateral facet arthritis.  Impression and Recommendations:    I spent 40 minutes with this patient, greater than 50% was face-to-face time counseling regarding the above diagnoses

## 2015-05-10 NOTE — Assessment & Plan Note (Signed)
MRI personally reviewed, there is some degree of facet arthritis bilaterally at L4-5 and L5-S1. We are going to work initially on aggressive weight loss and and meloxicam, she will also work with her home rehabilitation exercises before considering facet injections and subsequently radiofrequency ablation.

## 2015-05-28 ENCOUNTER — Ambulatory Visit (INDEPENDENT_AMBULATORY_CARE_PROVIDER_SITE_OTHER): Payer: Federal, State, Local not specified - PPO | Admitting: Sports Medicine

## 2015-05-28 ENCOUNTER — Encounter: Payer: Self-pay | Admitting: Sports Medicine

## 2015-05-28 VITALS — BP 115/72 | HR 70 | Resp 16 | Wt 264.7 lb

## 2015-05-28 DIAGNOSIS — M797 Fibromyalgia: Secondary | ICD-10-CM | POA: Diagnosis not present

## 2015-05-28 DIAGNOSIS — E669 Obesity, unspecified: Secondary | ICD-10-CM

## 2015-05-28 MED ORDER — GABAPENTIN 800 MG PO TABS: 800.0000 mg | ORAL_TABLET | Freq: Two times a day (BID) | ORAL | Status: DC

## 2015-05-28 NOTE — Assessment & Plan Note (Signed)
With some right-sided thigh and knee pain, MRI did not show any findings that could be responsible for this, and knee exam was unremarkable. This leaves fibromyalgia versus meralgia paresthetica in the differential. We are going to increase her gabapentin to 800 mg twice a day.

## 2015-05-28 NOTE — Assessment & Plan Note (Signed)
Has not yet started phentermine, she will start this and return to see me in one month.

## 2015-05-28 NOTE — Progress Notes (Signed)
  Subjective:    CC: Right leg pain  HPI: This is a pleasant 55 year old female with fibromyalgia, she comes in with a few weeks of increasing pain on the right side of her leg, and thigh, to the lateral knee pain is moderate, persistent. No trauma, previous MRI showed no likelihood of a radicular source.  Obesity: Did not yet start phentermine.    Past medical history, Surgical history, Family history not pertinant except as noted below, Social history, Allergies, and medications have been entered into the medical record, reviewed, and no changes needed.   Review of Systems: No fevers, chills, night sweats, weight loss, chest pain, or shortness of breath.   Objective:    General: Well Developed, well nourished, and in no acute distress.  Neuro: Alert and oriented x3, extra-ocular muscles intact, sensation grossly intact.  HEENT: Normocephalic, atraumatic, pupils equal round reactive to light, neck supple, no masses, no lymphadenopathy, thyroid nonpalpable.  Skin: Warm and dry, no rashes. Cardiac: Regular rate and rhythm, no murmurs rubs or gallops, no lower extremity edema.  Respiratory: Clear to auscultation bilaterally. Not using accessory muscles, speaking in full sentences. Right Knee: Normal to inspection with no erythema or effusion or obvious bony abnormalities. Palpation normal with no warmth or joint line tenderness or patellar tenderness or condyle tenderness. ROM normal in flexion and extension and lower leg rotation. Ligaments with solid consistent endpoints including ACL, PCL, LCL, MCL. Negative Mcmurray's and provocative meniscal tests. Non painful patellar compression. Patellar and quadriceps tendons unremarkable. Hamstring and quadriceps strength is normal. Right Hip: ROM IR: 60 Deg, ER: 60 Deg, Flexion: 120 Deg, Extension: 100 Deg, Abduction: 45 Deg, Adduction: 45 Deg Strength IR: 5/5, ER: 5/5, Flexion: 5/5, Extension: 5/5, Abduction: 5/5, Adduction: 5/5 Pelvic  alignment unremarkable to inspection and palpation. Standing hip rotation and gait without trendelenburg / unsteadiness. Greater trochanter without tenderness to palpation. No tenderness over piriformis. No SI joint tenderness and normal minimal SI movement. No tenderness just medial to the anterior superior iliac spine  Impression and Recommendations:

## 2015-06-07 ENCOUNTER — Ambulatory Visit: Payer: Federal, State, Local not specified - PPO | Admitting: Sports Medicine

## 2015-06-25 ENCOUNTER — Ambulatory Visit: Payer: Federal, State, Local not specified - PPO | Admitting: Sports Medicine

## 2015-08-05 ENCOUNTER — Ambulatory Visit: Payer: Federal, State, Local not specified - PPO | Admitting: Sports Medicine

## 2015-11-23 DIAGNOSIS — G589 Mononeuropathy, unspecified: Secondary | ICD-10-CM | POA: Insufficient documentation

## 2015-11-27 ENCOUNTER — Ambulatory Visit (INDEPENDENT_AMBULATORY_CARE_PROVIDER_SITE_OTHER): Payer: Federal, State, Local not specified - PPO | Admitting: Family Medicine

## 2015-11-27 ENCOUNTER — Encounter: Payer: Self-pay | Admitting: Family Medicine

## 2015-11-27 VITALS — BP 108/64 | HR 75 | Wt 235.0 lb

## 2015-11-27 DIAGNOSIS — M65331 Trigger finger, right middle finger: Secondary | ICD-10-CM | POA: Diagnosis not present

## 2015-11-27 DIAGNOSIS — M65321 Trigger finger, right index finger: Secondary | ICD-10-CM | POA: Diagnosis not present

## 2015-11-27 NOTE — Progress Notes (Signed)
Subjective:    I'm seeing this patient as a consultation for:  Dr. Harlow MaresELDON S BEARD, MD   CC: Trigger finger  HPI: Patient presents to clinic today for evaluation of trigger finger. She's had trigger finger in the past with directions in November 2016 that worked until recently. Over the past several months she notes triggering of her right index and long finger. This is painful and interfering with work. She notes typically the triggering occurs with activity and when she wakes for sleep. She notes soreness and pain. She denies any radiating pain weakness or numbness fevers or chills. She has not used any treatment recently.  Past medical history, Surgical history, Family history not pertinant except as noted below, Social history, Allergies, and medications have been entered into the medical record, reviewed, and no changes needed.   Review of Systems: No headache, visual changes, nausea, vomiting, diarrhea, constipation, dizziness, abdominal pain, skin rash, fevers, chills, night sweats, weight loss, swollen lymph nodes, body aches, joint swelling, muscle aches, chest pain, shortness of breath, mood changes, visual or auditory hallucinations.   Objective:    Vitals:   11/27/15 1122  BP: 108/64  Pulse: 75   General: Well Developed, well nourished, and in no acute distress.  Neuro/Psych: Alert and oriented x3, extra-ocular muscles intact, able to move all 4 extremities, sensation grossly intact. Skin: Warm and dry, no rashes noted.  Respiratory: Not using accessory muscles, speaking in full sentences, trachea midline.  Cardiovascular: Pulses palpable, no extremity edema. Abdomen: Does not appear distended. MSK: Right index and long fingers are normal-appearing and only mildly tender at the palm are MCPs. Triggering present with flexion of the PIP. Capillary refill sensation are intact distally. Normal flexion and extension strength.  Procedure: Real-time Ultrasound Guided Injection of  right 2nd flexor tendon sheath at the A1 pulley  Device: GE Logiq E   Images permanently stored and available for review in the ultrasound unit. Verbal informed consent obtained.  Discussed risks and benefits of procedure. Warned about infection bleeding damage to structures skin hypopigmentation and fat atrophy among others. Patient expresses understanding and agreement Time-out conducted.   Noted no overlying erythema, induration, or other signs of local infection.   Skin prepped in a sterile fashion.   Local anesthesia: Topical Ethyl chloride.   With sterile technique and under real time ultrasound guidance:  25-gauge needle advanced into the flexor tendon sheath and 1 mL kenalog 40, 1 mL lidocaine injected easily..   Completed without difficulty   Pain immediately resolved suggesting accurate placement of the medication.   Advised to call if fevers/chills, erythema, induration, drainage, or persistent bleeding.   Images permanently stored and available for review in the ultrasound unit.  Impression: Technically successful ultrasound guided injection.  Procedure: Real-time Ultrasound Guided Injection of right 3rd flexor tendon sheath at the A1 pulley  Device: GE Logiq E   Images permanently stored and available for review in the ultrasound unit. Verbal informed consent obtained.  Discussed risks and benefits of procedure. Warned about infection bleeding damage to structures skin hypopigmentation and fat atrophy among others. Patient expresses understanding and agreement Time-out conducted.   Noted no overlying erythema, induration, or other signs of local infection.   Skin prepped in a sterile fashion.   Local anesthesia: Topical Ethyl chloride.   With sterile technique and under real time ultrasound guidance:  25-gauge needle advanced into the flexor tendon sheath and 1 mL kenalog 40, 1 mL lidocaine injected easily..   Completed  without difficulty   Pain immediately resolved suggesting  accurate placement of the medication.   Advised to call if fevers/chills, erythema, induration, drainage, or persistent bleeding.   Images permanently stored and available for review in the ultrasound unit.  Impression: Technically successful ultrasound guided injection.  Double Band-Aid splints applied to both the second and third PIPs  No results found for this or any previous visit (from the past 24 hour(s)). No results found.  Impression and Recommendations:    Assessment and Plan53: 54 y.o. female with Trigger finger of second and third digits of the right hand.  Repeat injections today with double Band-Aid splinting. Recheck in a few weeks if not improved. Return as needed.   Discussed warning signs or symptoms. Please see discharge instructions. Patient expresses understanding.   CC: Harlow Mares, MD

## 2015-11-27 NOTE — Patient Instructions (Signed)
Thank you for coming in today. Call or go to the ER if you develop a large red swollen joint with extreme pain or oozing puss.   Trigger Finger Trigger finger (digital tendinitis and stenosing tenosynovitis) is a common disorder that causes an often painful catching of the fingers or thumb. It occurs as a clicking, snapping, or locking of a finger in the palm of the hand. This is caused by a problem with the tendons that flex or bend the fingers sliding smoothly through their sheaths. The condition may occur in any finger or a couple fingers at the same time.  The finger may lock with the finger curled or suddenly straighten out with a snap. This is more common in patients with rheumatoid arthritis and diabetes. Left untreated, the condition may get worse to the point where the finger becomes locked in flexion, like making a fist, or less commonly locked with the finger straightened out. CAUSES   Inflammation and scarring that lead to swelling around the tendon sheath.  Repeated or forceful movements.  Rheumatoid arthritis, an autoimmune disease that affects joints.  Gout.  Diabetes mellitus. SIGNS AND SYMPTOMS  Soreness and swelling of your finger.  A painful clicking or snapping as you bend and straighten your finger. DIAGNOSIS  Your health care provider will do a physical exam of your finger to diagnose trigger finger. TREATMENT   Splinting for 6-8 weeks may be helpful.  Nonsteroidal anti-inflammatory medicines (NSAIDs) can help to relieve the pain and inflammation.  Cortisone injections, along with splinting, may speed up recovery. Several injections may be required. Cortisone may give relief after one injection.  Surgery is another treatment that may be used if conservative treatments do not work. Surgery can be minor, without incisions (a cut does not have to be made), and can be done with a needle through the skin.  Other surgical choices involve an open procedure in which  the surgeon opens the hand through a small incision and cuts the pulley so the tendon can again slide smoothly. Your hand will still work fine. HOME CARE INSTRUCTIONS  Apply ice to the injured area, twice per day:  Put ice in a plastic bag.  Place a towel between your skin and the bag.  Leave the ice on for 20 minutes, 3-4 times a day.  Rest your hand often. MAKE SURE YOU:   Understand these instructions.  Will watch your condition.  Will get help right away if you are not doing well or get worse.   This information is not intended to replace advice given to you by your health care provider. Make sure you discuss any questions you have with your health care provider.   Document Released: 01/25/2004 Document Revised: 12/07/2012 Document Reviewed: 09/06/2012 Elsevier Interactive Patient Education [employer]2016 [employer]

## 2015-11-27 NOTE — Progress Notes (Signed)
Notes sent to Harlow MaresBEARD, ELDON S, MD via epic.

## 2016-04-26 ENCOUNTER — Emergency Department
Admission: EM | Admit: 2016-04-26 | Discharge: 2016-04-26 | Disposition: A | Discharge status: Home / Self Care | Payer: Federal, State, Local not specified - PPO | Source: Home / Self Care | Attending: Family Medicine | Admitting: Family Medicine

## 2016-04-26 DIAGNOSIS — J0101 Acute recurrent maxillary sinusitis: Secondary | ICD-10-CM

## 2016-04-26 MED ORDER — AZITHROMYCIN 250 MG PO TABS: 250.0000 mg | ORAL_TABLET | Freq: Every day | ORAL | 0 refills | Status: DC

## 2016-04-26 NOTE — ED Provider Notes (Signed)
CSN: [id number]     Arrival date & time 04/26/16  1143 History   None    Chief Complaint  Patient presents with  . Nasal Congestion  . Headache  . Sore Throat   (Consider location/radiation/quality/duration/timing/severity/associated sxs/prior Treatment) The history is provided by the patient. No language interpreter was used.  Headache  Pain location:  Frontal Radiates to:  Does not radiate Severity currently:  8/10 Onset quality:  Unable to specify Progression:  Worsening Similar to prior headaches: no   Relieved by:  Nothing Worsened by:  Nothing Associated symptoms: congestion, cough and sinus pressure   Sore Throat  Associated symptoms include headaches.  Pt complains of fever, achy, cough and congestion.    Past Medical History:  Diagnosis Date  . Anxiety   . Cervical spondylosis   . Chronic fatigue syndrome   . Dermatillomania   . Dysthymia   . Fibromyalgia   . Hypercholesteremia   . Hypotension   . Idiopathic neuropathy   . Obesity   . Plantar fasciitis   . Vertigo    Past Surgical History:  Procedure Laterality Date  . ABDOMINAL HYSTERECTOMY    . ADENOIDECTOMY    . CARPAL TUNNEL RELEASE    . tenosynovitis    . TONSILLECTOMY    . TUBAL LIGATION     Family History  Problem Relation Age of Onset  . Thyroid disease Mother   . Heart failure Father   . Diabetes Father   . Cancer Father    Social History  Substance Use Topics  . Smoking status: Never Smoker  . Smokeless tobacco: Never Used  . Alcohol use No   OB History    No data available     Review of Systems  HENT: Positive for congestion and sinus pressure.   Respiratory: Positive for cough.   Neurological: Positive for headaches.  All other systems reviewed and are negative.   Allergies  Amoxicillin; Avelox [moxifloxacin hcl in nacl]; Clindamycin/lincomycin; Erythromycin; Flagyl [metronidazole hcl]; Macrobid [nitrofurantoin macrocrystal]; Macrolides and ketolides; Penicillins; and Sulfa  antibiotics  Home Medications   Prior to Admission medications   Medication Sig Start Date End Date Taking? Authorizing Provider  azithromycin (ZITHROMAX) 250 MG tablet Take 1 tablet (250 mg total) by mouth daily. Take first 2 tablets together, then 1 every day until finished. 04/26/16   Elson AreasLeslie K Sofia, PA-C  clonazePAM (KLONOPIN) 0.5 MG tablet Take 0.5 mg by mouth at bedtime as needed.    Historical Provider, MD  diclofenac sodium (VOLTAREN) 1 % GEL Apply 1 application topically 3 (three) times daily. Apply 1 gm each application 02/23/15   Lattie HawStephen A Beese, MD  fluticasone (FLONASE) 50 MCG/ACT nasal spray Place 2 sprays into the nose 2 (two) times daily. Reported on 05/28/2015    Historical Provider, MD  gabapentin (NEURONTIN) 800 MG tablet Take 1 tablet (800 mg total) by mouth 2 (two) times daily. 05/28/15   Monica Bectonhomas J Thekkekandam, MD  l-methylfolate-B6-B12 (METANX) 3-35-2 MG TABS Take 1 tablet by mouth daily.    Historical Provider, MD  levothyroxine (SYNTHROID, LEVOTHROID) 50 MCG tablet Take 75 mcg by mouth daily.     Historical Provider, MD  meloxicam (MOBIC) 15 MG tablet One tab PO qAM with breakfast for 2 weeks, then daily prn pain. 05/10/15   Monica Bectonhomas J Thekkekandam, MD  phentermine (ADIPEX-P) 37.5 MG tablet One tab by mouth qAM 05/10/15   Monica Bectonhomas J Thekkekandam, MD  sertraline (ZOLOFT) 100 MG tablet Take 100 mg by mouth daily.  Historical Provider, MD  Vitamin D, Ergocalciferol, (DRISDOL) 50000 UNITS CAPS Take 5,000 Units by mouth.    Historical Provider, MD   Meds Ordered and Administered this Visit  Medications - No data to display  BP 113/73 (BP Location: Left Arm)   Pulse 78   Temp 98.9 F (37.2 C) (Oral)   Resp 16   Ht 5\' 5"  (1.651 m)   Wt 232 lb (105.2 kg)   SpO2 98%   BMI 38.61 kg/m  No data found.   Physical Exam  Constitutional: She is oriented to person, place, and time. She appears well-developed and well-nourished.  HENT:  Head: Normocephalic.  Right Ear: External ear  normal.  Left Ear: External ear normal.  Eyes: Conjunctivae and EOM are normal. Pupils are equal, round, and reactive to light.  Neck: Normal range of motion.  Cardiovascular: Normal rate and regular rhythm.   Pulmonary/Chest: Effort normal.  Abdominal: Soft.  Musculoskeletal: She exhibits edema.  Neurological: She is alert and oriented to person, place, and time.  Skin: Skin is warm.  Psychiatric: She has a normal mood and affect.  Nursing note and vitals reviewed.   Urgent Care Course   Clinical Course     Procedures (including critical care time)  Labs Review Labs Reviewed - No data to display  Imaging Review No results found.   Visual Acuity Review  Right Eye Distance:   Left Eye Distance:   Bilateral Distance:    Right Eye Near:   Left Eye Near:    Bilateral Near:         MDM  Pt has multiple recurrent sinus infections.  Pt advised to try otc medications.  I will give rx for zithromax but I advised her to try symptomatic care before starting   1. Acute recurrent maxillary sinusitis    Meds ordered this encounter  Medications  . azithromycin (ZITHROMAX) 250 MG tablet    Sig: Take 1 tablet (250 mg total) by mouth daily. Take first 2 tablets together, then 1 every day until finished.    Dispense:  6 tablet    Refill:  0    Order Specific Question:   Supervising Provider    Answer:   Donna Christen A [[id number]]     Elson Areas, PA-C 04/26/16 1232

## 2016-04-26 NOTE — ED Triage Notes (Signed)
States 3 days of congestion, headache, sore throat and fatigue.Took ibuprofen 800 mg at 0700.

## 2016-07-28 DIAGNOSIS — G4700 Insomnia, unspecified: Secondary | ICD-10-CM | POA: Insufficient documentation

## 2016-07-28 DIAGNOSIS — L2081 Atopic neurodermatitis: Secondary | ICD-10-CM | POA: Insufficient documentation

## 2016-09-07 ENCOUNTER — Telehealth: Payer: Self-pay | Admitting: Emergency Medicine

## 2016-09-07 ENCOUNTER — Emergency Department
Admission: EM | Admit: 2016-09-07 | Discharge: 2016-09-07 | Disposition: A | Discharge status: Home / Self Care | Payer: Federal, State, Local not specified - PPO | Source: Home / Self Care | Attending: Family Medicine | Admitting: Family Medicine

## 2016-09-07 ENCOUNTER — Encounter: Payer: Self-pay | Admitting: Emergency Medicine

## 2016-09-07 ENCOUNTER — Emergency Department (INDEPENDENT_AMBULATORY_CARE_PROVIDER_SITE_OTHER): Payer: Federal, State, Local not specified - PPO

## 2016-09-07 DIAGNOSIS — W1841XA Slipping, tripping and stumbling without falling due to stepping on object, initial encounter: Secondary | ICD-10-CM

## 2016-09-07 DIAGNOSIS — S82891A Other fracture of right lower leg, initial encounter for closed fracture: Secondary | ICD-10-CM

## 2016-09-07 DIAGNOSIS — S93401A Sprain of unspecified ligament of right ankle, initial encounter: Secondary | ICD-10-CM | POA: Diagnosis not present

## 2016-09-07 NOTE — ED Triage Notes (Signed)
Pt c/o right ankle and foot pain after falling in the yard yesterday. Using ice and motrin with some relief.

## 2016-09-07 NOTE — Discharge Instructions (Signed)
Apply ice pack for 30 minutes every 1 to 2 hours today and tomorrow.  Elevate.  Use crutches for 5 to 7 days until follow-up by Dr. Denyse Amassorey.  Wear Ace wrap until swelling decreases.   May continue Ibuprofen 200mg , 4 tabs every 8 hours with food.

## 2016-09-07 NOTE — ED Provider Notes (Signed)
[facility]    CSN: [id number] Arrival date & time: 09/07/16  1042     History   Chief Complaint Chief Complaint  Patient presents with  . Ankle Pain    HPI Barbara Wall is a 56 y.o. female.   Patient tripped over a tree root yesterday at 6pm, injuring her right ankle.  She reports that she has crutches and a cam walker at home.   The history is provided by the patient.  Ankle Pain  Location:  Ankle Time since incident:  1 day Injury: yes   Mechanism of injury: fall   Ankle location:  R ankle Pain details:    Quality:  Aching   Radiates to:  Does not radiate   Severity:  Moderate   Onset quality:  Sudden   Duration:  1 day   Timing:  Constant   Progression:  Unchanged Chronicity:  New Prior injury to area:  No Worsened by:  Nothing Ineffective treatments:  Ice Associated symptoms: decreased ROM, stiffness and swelling   Associated symptoms: no back pain, no muscle weakness, no numbness and no tingling   Risk factors: obesity     Past Medical History:  Diagnosis Date  . Anxiety   . Cervical spondylosis   . Chronic fatigue syndrome   . Dermatillomania   . Dysthymia   . Fibromyalgia   . Hypercholesteremia   . Hypotension   . Idiopathic neuropathy   . Obesity   . Plantar fasciitis   . Vertigo     Patient Active Problem List   Diagnosis Date Noted  . Trigger index finger of right hand 11/27/2015  . Fibromyalgia 05/28/2015  . Obesity 05/10/2015  . Low back pain without sciatica 03/26/2015  . Trigger middle finger of right hand 02/26/2015    Past Surgical History:  Procedure Laterality Date  . ABDOMINAL HYSTERECTOMY    . ADENOIDECTOMY    . CARPAL TUNNEL RELEASE    . tenosynovitis    . TONSILLECTOMY    . TUBAL LIGATION      OB History    No data available       Home Medications    Prior to Admission medications   Medication Sig Start Date End Date Taking? Authorizing Provider  clonazePAM (KLONOPIN) 0.5 MG tablet Take  0.5 mg by mouth at bedtime as needed.    [provider]  diclofenac sodium (VOLTAREN) 1 % GEL Apply 1 application topically 3 (three) times daily. Apply 1 gm each application 02/23/15   Lattie Haw, MD  gabapentin (NEURONTIN) 800 MG tablet Take 1 tablet (800 mg total) by mouth 2 (two) times daily. 05/28/15   Monica Becton, MD  l-methylfolate-B6-B12 (METANX) 3-35-2 MG TABS Take 1 tablet by mouth daily.    [provider]  levothyroxine (SYNTHROID, LEVOTHROID) 50 MCG tablet Take 75 mcg by mouth daily.     [provider]  meloxicam (MOBIC) 15 MG tablet One tab PO qAM with breakfast for 2 weeks, then daily prn pain. 05/10/15   Monica Becton, MD  phentermine (ADIPEX-P) 37.5 MG tablet One tab by mouth qAM 05/10/15   Monica Becton, MD  sertraline (ZOLOFT) 100 MG tablet Take 100 mg by mouth daily.    [provider]  Vitamin D, Ergocalciferol, (DRISDOL) 50000 UNITS CAPS Take 5,000 Units by mouth.    [provider]    Family History Family History  Problem Relation Age of Onset  . Thyroid disease Mother   .  Heart failure Father   . Diabetes Father   . Cancer Father     Social History Social History  Substance Use Topics  . Smoking status: Never Smoker  . Smokeless tobacco: Never Used  . Alcohol use No     Allergies   Amoxicillin; Avelox [moxifloxacin hcl in nacl]; Clindamycin/lincomycin; Erythromycin; Flagyl [metronidazole hcl]; Macrobid [nitrofurantoin macrocrystal]; Macrolides and ketolides; Penicillins; and Sulfa antibiotics   Review of Systems Review of Systems  Musculoskeletal: Positive for stiffness. Negative for back pain.  All other systems reviewed and are negative.    Physical Exam Triage Vital Signs ED Triage Vitals  Enc Vitals Group     BP 09/07/16 1115 122/81     Pulse Rate 09/07/16 1115 76     Resp --      Temp 09/07/16 1115 98.2 F (36.8 C)     Temp Source 09/07/16 1115 Oral     SpO2  09/07/16 1115 98 %     Weight 09/07/16 1116 240 lb (108.9 kg)     Height --      Head Circumference --      Peak Flow --      Pain Score 09/07/16 1116 8     Pain Loc --      Pain Edu? --      Excl. in GC? --    No data found.   Updated Vital Signs BP 122/81 (BP Location: Right Arm)   Pulse 76   Temp 98.2 F (36.8 C) (Oral)   Wt 240 lb (108.9 kg)   SpO2 98%   BMI 39.94 kg/m   Visual Acuity Right Eye Distance:   Left Eye Distance:   Bilateral Distance:    Right Eye Near:   Left Eye Near:    Bilateral Near:     Physical Exam  Constitutional: She appears well-developed and well-nourished. No distress.  HENT:  Head: Atraumatic.  Eyes: Pupils are equal, round, and reactive to light.  Neck: Normal range of motion.  Cardiovascular: Normal rate.   Pulmonary/Chest: Effort normal.  Musculoskeletal:       Right ankle: She exhibits decreased range of motion and swelling. She exhibits no ecchymosis and no deformity. Tenderness. Lateral malleolus tenderness found. Achilles tendon normal.       Feet:  Right ankle:  Decreased range of motion.  Tenderness and swelling over the lateral malleolus.  Joint stable.  No tenderness over the base of the fifth metatarsal.  Distal neurovascular function is intact.   Neurological: She is alert.  Skin: Skin is warm and dry.  Nursing note and vitals reviewed.    UC Treatments / Results  Labs (all labs ordered are listed, but only abnormal results are displayed) Labs Reviewed - No data to display  EKG  EKG Interpretation None       Radiology Dg Ankle Complete Right  Result Date: 09/07/2016 CLINICAL DATA:  Tripped over a root yesterday in the yard twisting RIGHT ankle, inversion injury, lateral malleolar pain and swelling RIGHT ankle EXAM: RIGHT ANKLE - COMPLETE 3+ VIEW COMPARISON:  None FINDINGS: Significant soft tissue swelling laterally extending anteriorly. Osseous mineralization normal. Joint spaces preserved. Tiny bony  densities are seen at the lateral ankle, uncertain if represents avulsion fragments arising from the talus or lateral malleolar tip. No additional fracture, dislocation, or bone destruction. IMPRESSION: Potential tiny avulsion fragments at the lateral ankle, uncertain if arising from the lateral margin of the talus or less likely the lateral malleolar tip. Electronically Signed  By: Ulyses SouthwardMark  Boles M.D.   On: 09/07/2016 11:56    Procedures Procedures (including critical care time)  Medications Ordered in UC Medications - No data to display   Initial Impression / Assessment and Plan / UC Course  I have reviewed the triage vital signs and the nursing notes.  Pertinent labs & imaging results that were available during my care of the patient were reviewed by me and considered in my medical decision making (see chart for details).    Ace wrap applied. Apply ice pack for 30 minutes every 1 to 2 hours today and tomorrow.  Elevate.  Use crutches for 5 to 7 days until follow-up by Dr. Denyse Amassorey.  Wear Ace wrap until swelling decreases.   May continue Ibuprofen 200mg , 4 tabs every 8 hours with food.  Followup with Dr. Denyse Amassorey as soon as possible.     Final Clinical Impressions(s) / UC Diagnoses   Final diagnoses:  Sprain of right ankle, unspecified ligament, initial encounter  Avulsion fracture of right ankle, closed, initial encounter    New Prescriptions Current Discharge Medication List       Lattie HawBeese, Betheny Suchecki A, MD 09/14/16 2308

## 2016-09-15 ENCOUNTER — Encounter: Payer: Self-pay | Admitting: Family Medicine

## 2016-09-15 ENCOUNTER — Ambulatory Visit (INDEPENDENT_AMBULATORY_CARE_PROVIDER_SITE_OTHER): Payer: Federal, State, Local not specified - PPO | Admitting: Family Medicine

## 2016-09-15 VITALS — BP 133/74 | HR 80 | Wt 250.0 lb

## 2016-09-15 DIAGNOSIS — S93491A Sprain of other ligament of right ankle, initial encounter: Secondary | ICD-10-CM

## 2016-09-15 DIAGNOSIS — S93401A Sprain of unspecified ligament of right ankle, initial encounter: Secondary | ICD-10-CM | POA: Insufficient documentation

## 2016-09-15 DIAGNOSIS — M65331 Trigger finger, right middle finger: Secondary | ICD-10-CM | POA: Diagnosis not present

## 2016-09-15 NOTE — Patient Instructions (Signed)
Thank you for coming in today. Attend PT.  Return in 2 weeks.  Use the cam walker boot as needed.  Do not drive in the boot.  Take it easy with your right finger.   Call or go to the ER if you develop a large red swollen joint with extreme pain or oozing puss.    Ankle Sprain, Phase I Rehab Ask your health care provider which exercises are safe for you. Do exercises exactly as told by your health care provider and adjust them as directed. It is normal to feel mild stretching, pulling, tightness, or discomfort as you do these exercises, but you should stop right away if you feel sudden pain or your pain gets worse.Do not begin these exercises until told by your health care provider. Stretching and range of motion exercises These exercises warm up your muscles and joints and improve the movement and flexibility of your lower leg and ankle. These exercises also help to relieve pain and stiffness. Exercise A: Gastroc and soleus stretch   1. Sit on the floor with your left / right leg extended. 2. Loop a belt or towel around the ball of your left / right foot. The ball of your foot is on the walking surface, right under your toes. 3. Keep your left / right ankle and foot relaxed and keep your knee straight while you use the belt or towel to pull your foot toward you. You should feel a gentle stretch behind your calf or knee. 4. Hold this position for __________ seconds, then release to the starting position. Repeat the exercise with your knee bent. You can put a pillow or a rolled bath towel under your knee to support it. You should feel a stretch deep in your calf or at your Achilles tendon. Repeat each stretch __________ times. Complete these stretches __________ times a day. Exercise B: Ankle alphabet   1. Sit with your left / right leg supported at the lower leg.  Do not rest your foot on anything.  Make sure your foot has room to move freely. 2. Think of your left / right foot as a  paintbrush, and move your foot to trace each letter of the alphabet in the air. Keep your hip and knee still while you trace. Make the letters as large as you can without feeling discomfort. 3. Trace every letter from A to Z. Repeat __________ times. Complete this exercise __________ times a day. Strengthening exercises These exercises build strength and endurance in your ankle and lower leg. Endurance is the ability to use your muscles for a long time, even after they get tired. Exercise C: Dorsiflexors   1. Secure a rubber exercise band or tube to an object, such as a table leg, that will stay still when the band is pulled. Secure the other end around your left / right foot. 2. Sit on the floor facing the object, with your left / right leg extended. The band or tube should be slightly tense when your foot is relaxed. 3. Slowly bring your foot toward you, pulling the band tighter. 4. Hold this position for __________ seconds. 5. Slowly return your foot to the starting position. Repeat __________ times. Complete this exercise __________ times a day. Exercise D: Plantar flexors   1. Sit on the floor with your left / right leg extended. 2. Loop a rubber exercise tube or band around the ball of your left / right foot. The ball of your foot is on the  walking surface, right under your toes.  Hold the ends of the band or tube in your hands.  The band or tube should be slightly tense when your foot is relaxed. 3. Slowly point your foot and toes downward, pushing them away from you. 4. Hold this position for __________ seconds. 5. Slowly return your foot to the starting position. Repeat __________ times. Complete this exercise __________ times a day. Exercise E: Evertors  1. Sit on the floor with your legs straight out in front of you. 2. Loop a rubber exercise band or tube around the ball of your left / right foot. The ball of your foot is on the walking surface, right under your toes.  Hold  the ends of the band in your hands, or secure the band to a stable object.  The band or tube should be slightly tense when your foot is relaxed. 3. Slowly push your foot outward, away from your other leg. 4. Hold this position for __________ seconds. 5. Slowly return your foot to the starting position. Repeat __________ times. Complete this exercise __________ times a day. This information is not intended to replace advice given to you by your health care provider. Make sure you discuss any questions you have with your health care provider. Document Released: 11/05/2004 Document Revised: 12/12/2015 Document Reviewed: 02/18/2015 [employer] Interactive Patient Education  2017 [employer]

## 2016-09-15 NOTE — Progress Notes (Signed)
Barbara Wall is a 56 y.o. female who presents to [facility] today for right ankle injury and right trigger finger.  Right ankle injury: Patient suffered an inversion injury to her right ankle on May 21. She was seen in urgent care where an x-ray showed a tiny avulsion fragment from the lateral talus. She's been using an Ace wrap which helps some. She denies any radiating pain weakness or numbness. She does note continued ankle pain worse with ambulation. She's tried some over-the-counter medicines for pain which helped.  Right trigger finger: Patient was seen in August 2017 for trigger finger involving the second and third digits of the right hand. She's noted increased hand activity at work and has recurrence of triggering pain and catching of the right third digit. She would like a repeat injection if possible. Symptoms are moderate to interfere with quality of life such as work and recreational activities. She has not tried much treatment for this aside from the injection almost a year ago.   Past Medical History:  Diagnosis Date  . Anxiety   . Cervical spondylosis   . Chronic fatigue syndrome   . Dermatillomania   . Dysthymia   . Fibromyalgia   . Hypercholesteremia   . Hypotension   . Idiopathic neuropathy   . Obesity   . Plantar fasciitis   . Vertigo    Past Surgical History:  Procedure Laterality Date  . ABDOMINAL HYSTERECTOMY    . ADENOIDECTOMY    . CARPAL TUNNEL RELEASE    . tenosynovitis    . TONSILLECTOMY    . TUBAL LIGATION     Social History  Substance Use Topics  . Smoking status: Never Smoker  . Smokeless tobacco: Never Used  . Alcohol use No     ROS:  As above   Medications: Current Outpatient Prescriptions  Medication Sig Dispense Refill  . clonazePAM (KLONOPIN) 0.5 MG tablet Take 0.5 mg by mouth at bedtime as needed.    . diclofenac sodium (VOLTAREN) 1 % GEL Apply 1 application topically 3 (three)  times daily. Apply 1 gm each application 100 g 0  . gabapentin (NEURONTIN) 800 MG tablet Take 1 tablet (800 mg total) by mouth 2 (two) times daily. 60 tablet 3  . l-methylfolate-B6-B12 (METANX) 3-35-2 MG TABS Take 1 tablet by mouth daily.    Marland Kitchen. levothyroxine (SYNTHROID, LEVOTHROID) 50 MCG tablet Take 75 mcg by mouth daily.     . meloxicam (MOBIC) 15 MG tablet One tab PO qAM with breakfast for 2 weeks, then daily prn pain. 30 tablet 3  . phentermine (ADIPEX-P) 37.5 MG tablet One tab by mouth qAM 30 tablet 0  . sertraline (ZOLOFT) 100 MG tablet Take 100 mg by mouth daily.    . Vitamin D, Ergocalciferol, (DRISDOL) 50000 UNITS CAPS Take 5,000 Units by mouth.     No current facility-administered medications for this visit.    Allergies  Allergen Reactions  . Amoxicillin   . Avelox [Moxifloxacin Hcl In Nacl]   . Clindamycin/Lincomycin   . Erythromycin   . Flagyl [Metronidazole Hcl]   . Macrobid [Nitrofurantoin Macrocrystal]   . Macrolides And Ketolides   . Penicillins   . Sulfa Antibiotics      Exam:  BP 133/74   Pulse 80   Wt 250 lb (113.4 kg)   BMI 41.60 kg/m  General: Well Developed, well nourished, and in no acute distress.  Neuro/Psych: Alert and oriented x3, extra-ocular muscles intact, able to  move all 4 extremities, sensation grossly intact. Skin: Warm and dry, no rashes noted.  Respiratory: Not using accessory muscles, speaking in full sentences, trachea midline.  Cardiovascular: Pulses palpable, no extremity edema. Abdomen: Does not appear distended. MSK:   Right ankle: Slightly swollen with puffiness laterally around the lateral malleolus. Tender to palpation at the ATFL area. Patient guards with motion testing and ligamentous stability testing. Pulses capillary refill and sensation are intact distally.  Right hand a well-appearing no obvious deformities. Mildly tender to palpation at the right third digit palmar MCP. Slight triggering present with flexion of the PIP  joint. Intact flexion and extension strength.   Procedure: Real-time Ultrasound Guided Injection of Right 3rd digit A1 pulley trigger injection  Device: GE Logiq E  Images permanently stored and available for review in the ultrasound unit. Verbal informed consent obtained. Discussed risks and benefits of procedure. Warned about infection bleeding damage to structures skin hypopigmentation and fat atrophy among others. Patient expresses understanding and agreement Time-out conducted.  Noted no overlying erythema, induration, or other signs of local infection.  Skin prepped in a sterile fashion.  Local anesthesia: Topical Ethyl chloride.  With sterile technique and under real time ultrasound guidance: 1ml lidocaine and 5mg  dexamethasone injected easily.  Completed without difficulty  Pain immediately resolved suggesting accurate placement of the medication.  Advised to call if fevers/chills, erythema, induration, drainage, or persistent bleeding.  Images permanently stored and available for review in the ultrasound unit.  Impression: Technically successful ultrasound guided injection.    Study Result   CLINICAL DATA:  Tripped over a root yesterday in the yard twisting RIGHT ankle, inversion injury, lateral malleolar pain and swelling RIGHT ankle  EXAM: RIGHT ANKLE - COMPLETE 3+ VIEW  COMPARISON:  None  FINDINGS: Significant soft tissue swelling laterally extending anteriorly.  Osseous mineralization normal.  Joint spaces preserved.  Tiny bony densities are seen at the lateral ankle, uncertain if represents avulsion fragments arising from the talus or lateral malleolar tip.  No additional fracture, dislocation, or bone destruction.  IMPRESSION: Potential tiny avulsion fragments at the lateral ankle, uncertain if arising from the lateral margin of the talus or less likely the lateral malleolar tip.   Electronically Signed   By: Ulyses Southward M.D.    On: 09/07/2016 11:56       No results found for this or any previous visit (from the past 48 hour(s)). No results found.    Assessment and Plan: 56 y.o. female with  Right Ankle Sprain: With possible avulsion of the ATFL. Plan for PT, cam walker, and RTC in 2 weeks.   Right 3rd trigger finger: S/P injection today. Plan for limited activity as tolerated. RTC PRN.     Orders Placed This Encounter  Procedures  . Ambulatory referral to Physical Therapy    Referral Priority:   Routine    Referral Type:   Physical Medicine    Referral Reason:   Specialty Services Required    Requested Specialty:   Physical Therapy    Number of Visits Requested:   1   No orders of the defined types were placed in this encounter.   Discussed warning signs or symptoms. Please see discharge instructions. Patient expresses understanding.

## 2016-09-23 ENCOUNTER — Encounter: Payer: Self-pay | Admitting: Physical Therapy

## 2016-09-23 ENCOUNTER — Ambulatory Visit (INDEPENDENT_AMBULATORY_CARE_PROVIDER_SITE_OTHER): Payer: Federal, State, Local not specified - PPO | Admitting: Physical Therapy

## 2016-09-23 DIAGNOSIS — M25571 Pain in right ankle and joints of right foot: Secondary | ICD-10-CM

## 2016-09-23 DIAGNOSIS — M6281 Muscle weakness (generalized): Secondary | ICD-10-CM | POA: Diagnosis not present

## 2016-09-23 DIAGNOSIS — R2689 Other abnormalities of gait and mobility: Secondary | ICD-10-CM | POA: Diagnosis not present

## 2016-09-23 DIAGNOSIS — R600 Localized edema: Secondary | ICD-10-CM | POA: Diagnosis not present

## 2016-09-23 DIAGNOSIS — M25671 Stiffness of right ankle, not elsewhere classified: Secondary | ICD-10-CM

## 2016-09-23 NOTE — Patient Instructions (Addendum)
    Practice walking in the pool keeping toes facing forward  Ankle Circles    Slowly rotate right foot and ankle clockwise then counterclockwise. Gradually increase range of motion. Avoid pain. Circle _10___ times each direction per set. Do _1___ sets per session. Do _2-3___ sessions per day.  Ankle Pump    With left leg elevated, gently flex and extend ankle. Move through full range of motion. Avoid pain. Repeat _10___ times per set. Do __1__ sets per session. Do __2-3__ sessions per day.  Ankle Alphabet    Using left ankle and foot only, trace the letters of the alphabet. Perform A to Z. Repeat __1__ times per set. Do _1___ sets per session. Do _2-3___ sessions per day.  Gastroc Stretch    Stand with right foot back, leg straight, forward leg bent. Keeping heel on floor, turned slightly out, lean into wall until stretch is felt in calf. Hold __30-45__ seconds. Repeat __1__ times per set. Do _1___ sets per session. Do __1__ sessions per day.  Balance: Three-Way Leg Swing    Stand on left foot, hands on hips. Reach other foot forward __1__ times, sideways __1__ times, back __1__ times. Hold each position __1__ seconds. Relax. Repeat __10__ times per set. Do ___2-3_ sets per session. Do __2-3__ sessions per day.  ANKLE: Eversion, Bilateral (Band)    Place band around feet. Keeping heels on floor, raise toes of both feet up and away from body. Do not move hips. Hold __2_ seconds. Use ___red_____ band. __10_ reps per set, _2-3__ sets per day, __7_ days per week  Copyright  VHI. All rights reserved.

## 2016-09-23 NOTE — Therapy (Signed)
[facility] [place] [address] [place], [place], [address] Phone: [phone]   Fax:  [phone]  Physical Therapy Evaluation  Patient Details  Name: Barbara Wall MRN: [id number] Date of Birth: [birth year] Referring Provider: Dr Teressa Lower  Encounter Date: 09/23/2016      PT End of Session - 09/23/16 1056    Visit Number 1   Number of Visits 12   Date for PT Re-Evaluation 11/04/16   PT Start Time 1056   PT Stop Time 1206   PT Time Calculation (min) 70 min   Activity Tolerance Patient tolerated treatment well      Past Medical History:  Diagnosis Date  . Anxiety   . Cervical spondylosis   . Chronic fatigue syndrome   . Dermatillomania   . Dysthymia   . Fibromyalgia   . Hypercholesteremia   . Hypotension   . Idiopathic neuropathy   . Obesity   . Plantar fasciitis   . Vertigo     Past Surgical History:  Procedure Laterality Date  . ABDOMINAL HYSTERECTOMY    . ADENOIDECTOMY    . CARPAL TUNNEL RELEASE    . tenosynovitis    . TONSILLECTOMY    . TUBAL LIGATION      There were no vitals filed for this visit.       Subjective Assessment - 09/23/16 1056    Subjective Pt tripped over a tree on 09/06/16, was seen in urgent care the next day. She has a CAM boot that she wears with walking distances. She has been getting in her pool and doing some light exercise, treading water.    Pertinent History 12/2015 syst removed from L5, trigger fingers , fibromyalgia, chronic nerve issues - itching   How long can you sit comfortably? no limitations    How long can you stand comfortably? has been staying off her ankle   How long can you walk comfortably? hasn't done much more than household walking now   Diagnostic tests x-rays - possible avulsion fx at sprain site.    Patient Stated Goals get back to walking without difficulty   Currently in Pain? Yes   Pain Score 2    Pain Location Ankle   Pain Orientation Right   Pain  Descriptors / Indicators Aching   Pain Type Acute pain   Pain Radiating Towards arch of foot   Pain Onset 1 to 4 weeks ago   Pain Frequency Intermittent   Aggravating Factors  twisting the foot   Pain Relieving Factors rest,             [facility] PT Assessment - 09/23/16 0001      Assessment   Medical Diagnosis Rt ankle anterior talofib sprain   Referring Provider Dr Teressa Lower   Onset Date/Surgical Date 09/06/16   Hand Dominance Right   Next MD Visit 10/07/16   Prior Therapy not for ankle     Precautions   Precautions --  no driving with the boot   Required Braces or Orthoses --  CAM boot for long walks     Balance Screen   Has the patient fallen in the past 6 months Yes   How many times? 1  causing the ankle sprain   Has the patient had a decrease in activity level because of a fear of falling?  No     Prior Function   Level of Independence Independent   Vocation Retired  recently   Leisure getting pool, adult  coloring, visit and shop with sister     Observation/Other Assessments   Focus on Therapeutic Outcomes (FOTO)  55% limited     Observation/Other Assessments-Edema    Edema Figure 8  Rt 54.8, Lt 53.8     Functional Tests   Functional tests Squat;Single leg stance     Squat   Comments WNL     Single Leg Stance   Comments Lt WNL, Rt > 15 sec, lots of accessory motion     Posture/Postural Control   Posture/Postural Control Postural limitations   Postural Limitations --  pes planus     ROM / Strength   AROM / PROM / Strength AROM;PROM;Strength     AROM   AROM Assessment Site Ankle   Right/Left Ankle Right;Left   Right Ankle Dorsiflexion 4   Right Ankle Plantar Flexion 72   Right Ankle Inversion 38   Right Ankle Eversion 15   Left Ankle Dorsiflexion 15   Left Ankle Inversion 65   Left Ankle Eversion 33     PROM   PROM Assessment Site Ankle   Right/Left Ankle Right   Right Ankle Dorsiflexion 12   Right Ankle Eversion 15     Strength    Strength Assessment Site Hip;Knee;Ankle   Right/Left Hip --  WNL   Right/Left Knee --  bilat WNL   Right/Left Ankle --  lt WNL, Rt grossly 4/5            Objective measurements completed on examination: See above findings.          [facility] Adult PT Treatment/Exercise - 09/23/16 0001      Exercises   Exercises Ankle     Modalities   Modalities Vasopneumatic     Vasopneumatic   Number Minutes Vasopneumatic  15 minutes   Vasopnuematic Location  Ankle  Rt   Vasopneumatic Pressure Low   Vasopneumatic Temperature  3*     Manual Therapy   Manual Therapy Taping   Manual therapy comments spider tape to lateral Rt ankle for edema     Ankle Exercises: Stretches   Gastroc Stretch 1 rep;30 seconds     Ankle Exercises: Standing   SLS 2x10 Rt with toe taps FWD/side/BWD     Ankle Exercises: Seated   ABC's 1 rep   Ankle Circles/Pumps Right;10 reps   Heel Raises 10 reps   Toe Raise 10 reps   Other Seated Ankle Exercises 2x10 eversion bilat with red band                PT Education - 09/23/16 1132    Education provided Yes   Education Details HEP, RICE   Person(s) Educated Patient   Methods Explanation;Demonstration;Handout   Comprehension Returned demonstration;Verbalized understanding             PT Long Term Goals - 09/23/16 1322      PT LONG TERM GOAL #1   Title I with advanced HEP ( 11/04/16)    Time 6   Period Weeks   Status New     PT LONG TERM GOAL #2   Title demo reduced edema Rt ankle figure 8 to with in 0.50 cm of Lt ( 11/04/16)    Time 6   Period Weeks   Status New     PT LONG TERM GOAL #3   Title increase Rt dorsflexion and eversion to WNL without pain ( 11/04/16)    Time 6   Period Weeks   Status New  PT LONG TERM GOAL #4   Title demo Rt ankle strength =/> 5-/5 to assist with return to normal walking ( 11/04/16)    Time 6   Period Weeks   Status New     PT LONG TERM GOAL #5   Title improve FOTO =/< 33% limited ( 11/04/16)     Time 6   Period Weeks   Status New                Plan - 09/23/16 1204    Clinical Impression Statement 56 yo female presents 2.5 weeks s/p Rt ankle sprain with possible alvulsion fx of the ATFL.  She has visible and palpable edema, decreased ROM and strength in the Rt ankle and gait abnormalities.    Clinical Presentation Evolving   Clinical Decision Making Low   Rehab Potential Excellent   PT Frequency 2x / week   PT Duration 6 weeks   PT Treatment/Interventions Moist Heat;Ultrasound;Therapeutic exercise;Dry needling;Taping;Vasopneumatic Device;Manual techniques;Neuromuscular re-education;Cryotherapy;Electrical Stimulation;Iontophoresis 4mg /ml Dexamethasone;Patient/family education   PT Next Visit Plan assess response to tape, work on ankle proprioception, modalities for edema   Consulted and Agree with Plan of Care Patient      Patient will benefit from skilled therapeutic intervention in order to improve the following deficits and impairments:  Decreased range of motion, Difficulty walking, Pain, Obesity, Decreased strength, Increased edema  Visit Diagnosis: Pain in right ankle and joints of right foot - Plan: PT plan of care cert/re-cert  Localized edema - Plan: PT plan of care cert/re-cert  Muscle weakness (generalized) - Plan: PT plan of care cert/re-cert  Other abnormalities of gait and mobility - Plan: PT plan of care cert/re-cert  Stiffness of right ankle, not elsewhere classified - Plan: PT plan of care cert/re-cert     Problem List Patient Active Problem List   Diagnosis Date Noted  . Right ankle sprain 09/15/2016  . Fibromyalgia 05/28/2015  . Obesity 05/10/2015  . Low back pain without sciatica 03/26/2015  . Trigger middle finger of right hand 02/26/2015    Roderic Scarce PT  09/23/2016, 1:27 PM  [facility] [address] [place], [place], [address] Phone: [phone]   Fax:   [phone]  Name: Barbara Wall MRN: [id number] Date of Birth: 30-Jan-1961

## 2016-09-28 ENCOUNTER — Ambulatory Visit (INDEPENDENT_AMBULATORY_CARE_PROVIDER_SITE_OTHER): Payer: Federal, State, Local not specified - PPO | Admitting: Physical Therapy

## 2016-09-28 DIAGNOSIS — M25571 Pain in right ankle and joints of right foot: Secondary | ICD-10-CM

## 2016-09-28 DIAGNOSIS — M6281 Muscle weakness (generalized): Secondary | ICD-10-CM | POA: Diagnosis not present

## 2016-09-28 DIAGNOSIS — R2689 Other abnormalities of gait and mobility: Secondary | ICD-10-CM

## 2016-09-28 DIAGNOSIS — R600 Localized edema: Secondary | ICD-10-CM | POA: Diagnosis not present

## 2016-09-28 DIAGNOSIS — M25671 Stiffness of right ankle, not elsewhere classified: Secondary | ICD-10-CM

## 2016-09-28 NOTE — Therapy (Addendum)
[address] [place], [place], [address] Phone: [phone]   Fax:  [phone]  Physical Therapy Treatment  Patient Details  Name: Alayza Pieper MRN: [id number] Date of Birth: 10/26/1960 Referring Provider: Dr. Georgina Snell   Encounter Date: 09/28/2016      PT End of Session - 09/28/16 1106    Visit Number 2   Number of Visits 12   Date for PT Re-Evaluation 11/04/16   PT Start Time 1101   PT Stop Time 1140   PT Time Calculation (min) 39 min   Activity Tolerance Patient tolerated treatment well   Behavior During Therapy [facility] for tasks assessed/performed      Past Medical History:  Diagnosis Date  . Anxiety   . Cervical spondylosis   . Chronic fatigue syndrome   . Dermatillomania   . Dysthymia   . Fibromyalgia   . Hypercholesteremia   . Hypotension   . Idiopathic neuropathy   . Obesity   . Plantar fasciitis   . Vertigo     Past Surgical History:  Procedure Laterality Date  . ABDOMINAL HYSTERECTOMY    . ADENOIDECTOMY    . CARPAL TUNNEL RELEASE    . tenosynovitis    . TONSILLECTOMY    . TUBAL LIGATION      There were no vitals filed for this visit.      Subjective Assessment - 09/28/16 1106    Subjective Pt reports the Rock tape only last a couple hours after visit. The tape was dangling after her visit to pool.  Her back is bothering her, as well as her Rt middle finger.  Her ankle is feeling pretty good today, no swelling or pain.    Patient Stated Goals get back to walking without difficulty   Currently in Pain? No/denies   Pain Score 0-No pain   Pain Location Ankle   Pain Orientation Right            [facility] PT Assessment - 09/28/16 0001      Assessment   Medical Diagnosis Rt ankle anterior talofib sprain   Referring Provider Dr. Georgina Snell    Onset Date/Surgical Date 09/06/16   Hand Dominance Right   Next MD Visit 10/07/16     Observation/Other Assessments-Edema    Edema Figure 8  - Lt 53.8     Figure 8 Edema FOTO   11% limited   Figure 8 - Right  52cm     AROM   Right Ankle Dorsiflexion 12          [facility] Adult PT Treatment/Exercise - 09/28/16 0001      Modalities   Modalities --  pt decline      Ankle Exercises: Stretches   Soleus Stretch 2 reps;30 seconds   Gastroc Stretch 30 seconds;4 reps     Ankle Exercises: Aerobic   Stationary Bike NuStep L4: 5.5 min      Ankle Exercises: Standing   SLS 2x10 Rt with toe taps FWD/side/BWD. Rt SLS x 30- sec  1 rep x 10 LLE   Heel Raises 10 reps  2 sets   Toe Raise 10 reps     Ankle Exercises: Seated   Ankle Circles/Pumps Right;10 reps   Heel Raises 20 reps   Toe Raise 20 reps   Other Seated Ankle Exercises 2x10 eversion Rt foot,  with red band, one set with green band.           PT Long Term Goals - 09/28/16  [place] #1   Title I with advanced HEP ( 11/04/16)    Time 6   Period Weeks   Status On-going     PT LONG TERM GOAL #2   Title demo reduced edema Rt ankle figure 8 to with in 0.50 cm of Lt ( 11/04/16)    Time 6   Period Weeks   Status Achieved     PT LONG TERM GOAL #3   Title increase Rt dorsflexion and eversion to WNL without pain ( 11/04/16)    Time 6   Period Weeks   Status Partially Met     PT LONG TERM GOAL #4   Title demo Rt ankle strength =/> 5-/5 to assist with return to normal walking ( 11/04/16)    Time 6   Period Weeks   Status On-going     PT LONG TERM GOAL #5   Title improve FOTO =/< 33% limited ( 11/04/16)    Time 6   Period Weeks   Status Achieved ( scored 11% limited)                Plan - 09/28/16 1144    Clinical Impression Statement Pt presents with decreased edema in Rt ankle; has met LTG #2.  She tolerated all exercises with increased difficulty without any increase in symptoms.  Pt progressing well towards established goals.   Pt verbalized interest in possibly holding therapy for 2 wks after MD visit tomorrow.     Rehab Potential  Excellent   PT Frequency 2x / week   PT Duration 6 weeks   PT Treatment/Interventions Moist Heat;Ultrasound;Therapeutic exercise;Dry needling;Taping;Vasopneumatic Device;Manual techniques;Neuromuscular re-education;Cryotherapy;Electrical Stimulation;Iontophoresis 14m/ml Dexamethasone;Patient/family education   PT Next Visit Plan Continue progressive Rt ankle strengthening and proprioception.  May place on hold for 2wks, depending MD visit tomorrow.  Informed supervising PT of plan.    Consulted and Agree with Plan of Care Patient      Patient will benefit from skilled therapeutic intervention in order to improve the following deficits and impairments:  Decreased range of motion, Difficulty walking, Pain, Obesity, Decreased strength, Increased edema  Visit Diagnosis: Pain in right ankle and joints of right foot  Localized edema  Muscle weakness (generalized)  Other abnormalities of gait and mobility  Stiffness of right ankle, not elsewhere classified     Problem List Patient Active Problem List   Diagnosis Date Noted  . Right ankle sprain 09/15/2016  . Fibromyalgia 05/28/2015  . Obesity 05/10/2015  . Low back pain without sciatica 03/26/2015  . Trigger middle finger of right hand 02/26/2015   JKerin Perna PTA 09/28/16 12:48 PM  [address] [address] [address]Phone: [phone]  Fax:  [phone] Name: MNguyen ButlerMRN: [id number] of Birth: 9Dec 16, 1962  PHYSICAL THERAPY DISCHARGE SUMMARY  Visits from Start of Care: 2  Current functional level related to goals / functional outcomes: See above   Remaining deficits: See above   Education / Equipment: HEP Plan: Patient agrees to discharge.  Patient goals were partially met. Patient is being discharged due to being pleased with the current functional level.  ?????    SJeral Pinch PT 09/29/16 12:00 PM

## 2016-09-29 ENCOUNTER — Ambulatory Visit (INDEPENDENT_AMBULATORY_CARE_PROVIDER_SITE_OTHER): Payer: Federal, State, Local not specified - PPO | Admitting: Family Medicine

## 2016-09-29 ENCOUNTER — Encounter: Payer: Self-pay | Admitting: Family Medicine

## 2016-09-29 VITALS — BP 133/65 | HR 70 | Wt 249.0 lb

## 2016-09-29 DIAGNOSIS — S93491A Sprain of other ligament of right ankle, initial encounter: Secondary | ICD-10-CM | POA: Diagnosis not present

## 2016-09-29 DIAGNOSIS — M65331 Trigger finger, right middle finger: Secondary | ICD-10-CM

## 2016-09-29 NOTE — Patient Instructions (Signed)
Thank you for coming in today. I recommend that you attend a hand therapy group.  I will give you a list.  Recheck as needed.  If not better with your hand especially if the triggering returns I would recommend hand surgery at that time.   As for your foot and ankle continue the home exercises.  Use the ankle brace when on unstable ground for 3 months.   My door is always open if something comes up.

## 2016-09-29 NOTE — Progress Notes (Signed)
Barbara Wall is a 56 y.o. female who presents to [facility] today for follow-up ankle sprain and trigger finger.  Ankle sprain: Patient was seen May 29 for ankle sprain. She was referred to physical therapy and has had near complete resolution of symptoms.  Trigger finger: Patient has had ongoing trigger finger of her right middle finger for some time now. She's had repeated injections most recently May 29 of 2018.  She notes that she continues to [facility] pain and stiffness. She does not yet have triggering. She thinks she is getting worse again. She denies any fevers or chills or severe pain.   Past Medical History:  Diagnosis Date  . Anxiety   . Cervical spondylosis   . Chronic fatigue syndrome   . Dermatillomania   . Dysthymia   . Fibromyalgia   . Hypercholesteremia   . Hypotension   . Idiopathic neuropathy   . Obesity   . Plantar fasciitis   . Vertigo    Past Surgical History:  Procedure Laterality Date  . ABDOMINAL HYSTERECTOMY    . ADENOIDECTOMY    . CARPAL TUNNEL RELEASE    . tenosynovitis    . TONSILLECTOMY    . TUBAL LIGATION     Social History  Substance Use Topics  . Smoking status: Never Smoker  . Smokeless tobacco: Never Used  . Alcohol use No     ROS:  As above   Medications: Current Outpatient Prescriptions  Medication Sig Dispense Refill  . clonazePAM (KLONOPIN) 0.5 MG tablet Take 0.5 mg by mouth at bedtime as needed.    . diclofenac sodium (VOLTAREN) 1 % GEL Apply 1 application topically 3 (three) times daily. Apply 1 gm each application 100 g 0  . gabapentin (NEURONTIN) 800 MG tablet Take 1 tablet (800 mg total) by mouth 2 (two) times daily. 60 tablet 3  . l-methylfolate-B6-B12 (METANX) 3-35-2 MG TABS Take 1 tablet by mouth daily.    Marland Kitchen levothyroxine (SYNTHROID, LEVOTHROID) 50 MCG tablet Take 75 mcg by mouth daily.     . meloxicam (MOBIC) 15 MG tablet One tab PO qAM with breakfast for 2 weeks,  then daily prn pain. 30 tablet 3  . phentermine (ADIPEX-P) 37.5 MG tablet One tab by mouth qAM 30 tablet 0  . sertraline (ZOLOFT) 100 MG tablet Take 100 mg by mouth daily.    . Vitamin D, Ergocalciferol, (DRISDOL) 50000 UNITS CAPS Take 5,000 Units by mouth.     No current facility-administered medications for this visit.    Allergies  Allergen Reactions  . Amoxicillin   . Avelox [Moxifloxacin Hcl In Nacl]   . Clindamycin/Lincomycin   . Erythromycin   . Flagyl [Metronidazole Hcl]   . Macrobid [Nitrofurantoin Macrocrystal]   . Macrolides And Ketolides   . Penicillins   . Sulfa Antibiotics      Exam:  BP 133/65   Pulse 70   Wt 249 lb (112.9 kg)   BMI 41.44 kg/m  General: Well Developed, well nourished, and in no acute distress.  Neuro/Psych: Alert and oriented x3, extra-ocular muscles intact, able to move all 4 extremities, sensation grossly intact. Skin: Warm and dry, no rashes noted.  Respiratory: Not using accessory muscles, speaking in full sentences, trachea midline.  Cardiovascular: Pulses palpable, no extremity edema. Abdomen: Does not appear distended. MSK:  Right hand third digit is normal-appearing no swelling. Mildly tender to palpation at home her MCP. Normal motion no triggering present. Right ankle normal appearing normal  motion normal gait  Limited musculoskeletal ultrasound of the flexor tendon of the right third digit reveals slight hypoechoic fluid accumulating at the A1 pulley with thickened transverse ligament with mild increased Doppler signal. No tendon defects are noted. Normal bony structures.    No results found for this or any previous visit (from the past 48 hour(s)). No results found.    Assessment and Plan: 56 y.o. female with trigger finger: Patient continues to express pain and stiffness in the right hand. I think she is potentially having recurrence of her trigger finger. She's had 3 injections now. I think it's reasonable to send for hand  therapy as this may help. If no benefit will likely need surgery.  Ankle sprain: Doing much better. Start home exercises return as needed.    Orders Placed This Encounter  Procedures  . Ambulatory referral to Physical Therapy    Referral Priority:   Routine    Referral Type:   Physical Medicine    Referral Reason:   Specialty Services Required    Requested Specialty:   Physical Therapy    Number of Visits Requested:   1   No orders of the defined types were placed in this encounter.   Discussed warning signs or symptoms. Please see discharge instructions. Patient expresses understanding.

## 2016-10-01 ENCOUNTER — Encounter: Payer: Federal, State, Local not specified - PPO | Admitting: Physical Therapy

## 2016-10-02 ENCOUNTER — Encounter: Payer: Self-pay | Admitting: Family Medicine

## 2017-02-18 DIAGNOSIS — F419 Anxiety disorder, unspecified: Secondary | ICD-10-CM | POA: Insufficient documentation

## 2017-05-24 ENCOUNTER — Encounter: Payer: Self-pay | Admitting: Obstetrics & Gynecology

## 2017-05-24 ENCOUNTER — Ambulatory Visit (INDEPENDENT_AMBULATORY_CARE_PROVIDER_SITE_OTHER): Payer: Federal, State, Local not specified - PPO | Admitting: Obstetrics & Gynecology

## 2017-05-24 VITALS — BP 119/73 | HR 88 | Resp 16 | Ht 65.0 in | Wt 244.0 lb

## 2017-05-24 DIAGNOSIS — L292 Pruritus vulvae: Secondary | ICD-10-CM

## 2017-05-24 NOTE — Progress Notes (Signed)
Patient ID: Barbara BurnsMelissa Wall, female   DOB: Jan 10, 1961, 57 y.o.   MRN: [id number]  No chief complaint on file.   HPI Barbara BurnsMelissa Wall is a 57 y.o. female.  Divorced P2 73(36 and 59 yo kids) here today with the issue of vulvar/ inner right labia minora dryness, itchy, scratchy. She was [facility] and was given mycolog which helped. She is using that daily for almost 7 years.  HPI  Past Medical History:  Diagnosis Date  . Anxiety   . Cervical spondylosis   . Chronic fatigue syndrome   . Dermatillomania   . Dysthymia   . Fibromyalgia   . Hypercholesteremia   . Hypotension   . Idiopathic neuropathy   . Obesity   . Plantar fasciitis   . Vertigo     Past Surgical History:  Procedure Laterality Date  . ABDOMINAL HYSTERECTOMY    . ADENOIDECTOMY    . CARPAL TUNNEL RELEASE    . tenosynovitis    . TONSILLECTOMY    . TUBAL LIGATION      Family History  Problem Relation Age of Onset  . Thyroid disease Mother   . Heart failure Father   . Diabetes Father   . Cancer Father     Social History Social History   Tobacco Use  . Smoking status: Never Smoker  . Smokeless tobacco: Never Used  Substance Use Topics  . Alcohol use: No  . Drug use: No    Allergies  Allergen Reactions  . Amoxicillin   . Avelox [Moxifloxacin Hcl In Nacl]   . Clindamycin/Lincomycin   . Erythromycin   . Flagyl [Metronidazole Hcl]   . Macrobid [Nitrofurantoin Macrocrystal]   . Macrolides And Ketolides   . Penicillins   . Sulfa Antibiotics     Current Outpatient Medications  Medication Sig Dispense Refill  . clonazePAM (KLONOPIN) 0.5 MG tablet Take 0.5 mg by mouth at bedtime as needed.    . hydrocortisone-pramoxine (ANALPRAM-HC) 2.5-1 % rectal cream APPLY ON THE SKIN AS DIRECTED AS NEEDED  12  . l-methylfolate-B6-B12 (METANX) 3-35-2 MG TABS Take 1 tablet by mouth daily.    Marland Kitchen. levothyroxine (SYNTHROID, LEVOTHROID) 100 MCG tablet     . nystatin-triamcinolone ointment (MYCOLOG) APPLY  TO AFFECTED AREA TWICE A DAY  3  . Pramoxine HCl 1 % CREA Apply 1 Dose topically as needed.    . Vitamin D, Ergocalciferol, (DRISDOL) 50000 UNITS CAPS Take 5,000 Units by mouth.     No current facility-administered medications for this visit.     Review of Systems Review of Systems Abstinent for at least 18 years. Retired last year from the [employer]. Declines a flu vaccine. Mammogram utd[place] FH- no breast/gyn/colon cancer She has fibromyalgia. Her exhusband was a philanderer.  Blood pressure 119/73, pulse 88, resp. rate 16, height 5\' 5"  (1.651 m), weight 244 lb (110.7 kg).  Physical Exam Physical Exam Breathing, conversing, and ambulating normally Well nourished, well hydrated White female, no apparent distress Vulva- appears normal with the exception of moderate atrophy Data Reviewed   Assessment    Vulvar discomfort    Plan    Check HSV 2 igG Prescribe vaginal/vulvar estrogen 3 days per week       Moishy Laday C Sotiria Keast 05/24/2017, 3:06 PM

## 2017-05-25 LAB — HSV 2 ANTIBODY, IGG: HSV 2 Glycoprotein G Ab, IgG: <0.9 index

## 2017-06-24 ENCOUNTER — Ambulatory Visit: Payer: Federal, State, Local not specified - PPO | Admitting: Obstetrics & Gynecology

## 2017-06-28 ENCOUNTER — Emergency Department
Admission: EM | Admit: 2017-06-28 | Discharge: 2017-06-28 | Disposition: A | Discharge status: Home / Self Care | Payer: Federal, State, Local not specified - PPO | Source: Home / Self Care | Attending: Family Medicine | Admitting: Family Medicine

## 2017-06-28 ENCOUNTER — Encounter: Payer: Self-pay | Admitting: Emergency Medicine

## 2017-06-28 DIAGNOSIS — B9789 Other viral agents as the cause of diseases classified elsewhere: Secondary | ICD-10-CM | POA: Diagnosis not present

## 2017-06-28 DIAGNOSIS — J069 Acute upper respiratory infection, unspecified: Secondary | ICD-10-CM

## 2017-06-28 NOTE — ED Provider Notes (Signed)
[facility]    CSN: [id number] Arrival date & time: 06/28/17  1216     History   Chief Complaint Chief Complaint  Patient presents with  . Sore Throat    HPI Barbara Wall is a 57 y.o. female.   Patient complains of three day history of typical cold-like symptoms developing over several days, including mild sore throat, sinus congestion, headache, fatigue, and cough.  She has had mild nausea without vomiting.  She feels tight in her anterior chest but no pleuritic pain.  She has had chills without fever.   The history is provided by the patient.    Past Medical History:  Diagnosis Date  . Anxiety   . Cervical spondylosis   . Chronic fatigue syndrome   . Dermatillomania   . Dysthymia   . Fibromyalgia   . Hypercholesteremia   . Hypotension   . Idiopathic neuropathy   . Obesity   . Plantar fasciitis   . Vertigo     Patient Active Problem List   Diagnosis Date Noted  . Right ankle sprain 09/15/2016  . Fibromyalgia 05/28/2015  . Obesity 05/10/2015  . Low back pain without sciatica 03/26/2015  . Trigger middle finger of right hand 02/26/2015    Past Surgical History:  Procedure Laterality Date  . ABDOMINAL HYSTERECTOMY    . ADENOIDECTOMY    . CARPAL TUNNEL RELEASE    . tenosynovitis    . TONSILLECTOMY    . TUBAL LIGATION      OB History    Gravida Para Term Preterm AB Living   2 2 2          SAB TAB Ectopic Multiple Live Births           2       Home Medications    Prior to Admission medications   Medication Sig Start Date End Date Taking? Authorizing Provider  clonazePAM (KLONOPIN) 0.5 MG tablet Take 0.5 mg by mouth at bedtime as needed.    [provider]  hydrocortisone-pramoxine [facility]) 2.5-1 % rectal cream APPLY ON THE SKIN AS DIRECTED AS NEEDED 02/24/17   [provider]  l-methylfolate-B6-B12 (METANX) 3-35-2 MG TABS Take 1 tablet by mouth daily.    [provider]  levothyroxine (SYNTHROID,  LEVOTHROID) 100 MCG tablet  05/21/17   [provider]  nystatin-triamcinolone ointment (MYCOLOG) APPLY TO AFFECTED AREA TWICE A DAY 05/19/17   [provider]  Pramoxine HCl 1 % CREA Apply 1 Dose topically as needed. 02/18/17 02/19/20  [provider]  Vitamin D, Ergocalciferol, (DRISDOL) 50000 UNITS CAPS Take 5,000 Units by mouth.    [provider]    Family History Family History  Problem Relation Age of Onset  . Thyroid disease Mother   . Heart failure Father   . Diabetes Father   . Cancer Father     Social History Social History   Tobacco Use  . Smoking status: Never Smoker  . Smokeless tobacco: Never Used  Substance Use Topics  . Alcohol use: No  . Drug use: No     Allergies   Amoxicillin; Avelox [moxifloxacin hcl in nacl]; Clindamycin/lincomycin; Erythromycin; Flagyl [metronidazole hcl]; Macrobid [nitrofurantoin macrocrystal]; Macrolides and ketolides; Penicillins; and Sulfa antibiotics   Review of Systems Review of Systems + sore throat + cough No pleuritic pain, but feels tight in anterior chest. No wheezing + nasal congestion + post-nasal drainage No sinus pain/pressure No itchy/red eyes No earache No hemoptysis No SOB No fever, +  chills + nausea No vomiting No abdominal pain No diarrhea No urinary symptoms No skin rash + fatigue No myalgias + headache Used OTC meds without relief   Physical Exam Triage Vital Signs ED Triage Vitals  Enc Vitals Group     BP 06/28/17 1316 128/84     Pulse Rate 06/28/17 1316 90     Resp --      Temp 06/28/17 1316 98 F (36.7 C)     Temp Source 06/28/17 1316 Oral     SpO2 06/28/17 1316 96 %     Weight 06/28/17 1317 241 lb (109.3 kg)     Height --      Head Circumference --      Peak Flow --      Pain Score 06/28/17 1317 0     Pain Loc --      Pain Edu? --      Excl. in GC? --    No data found.  Updated Vital Signs BP 128/84 (BP Location: Right Arm)   Pulse 90   Temp  98 F (36.7 C) (Oral)   Wt 241 lb (109.3 kg)   SpO2 96%   BMI 40.10 kg/m   Visual Acuity Right Eye Distance:   Left Eye Distance:   Bilateral Distance:    Right Eye Near:   Left Eye Near:    Bilateral Near:     Physical Exam Nursing notes and Vital Signs reviewed. Appearance:  Patient appears stated age, and in no acute distress Eyes:  Pupils are equal, round, and reactive to light and accomodation.  Extraocular movement is intact.  Conjunctivae are not inflamed  Ears:  Canals normal.  Tympanic membranes normal.  Nose:  Mildly congested turbinates.  No sinus tenderness.  Pharynx:  Minimal erythema. Neck:  Supple.  Enlarged posterior/lateral nodes are palpated bilaterally, tender to palpation on the left.   Lungs:  Clear to auscultation.  Breath sounds are equal.  Moving air well. Heart:  Regular rate and rhythm without murmurs, rubs, or gallops.  Abdomen:  Nontender without masses or hepatosplenomegaly.  Bowel sounds are present.  No CVA or flank tenderness.  Extremities:  No edema.  Skin:  No rash present.    UC Treatments / Results  Labs (all labs ordered are listed, but only abnormal results are displayed) Labs Reviewed - No data to display  EKG  EKG Interpretation None       Radiology No results found.  Procedures Procedures (including critical care time)  Medications Ordered in UC Medications - No data to display   Initial Impression / Assessment and Plan / UC Course  I have reviewed the triage vital signs and the nursing notes.  Pertinent labs & imaging results that were available during my care of the patient were reviewed by me and considered in my medical decision making (see chart for details).    There is no evidence of bacterial infection today.   Take plain guaifenesin (1200mg  extended release tabs such as Mucinex) twice daily, with plenty of water, for cough and congestion.  Get adequate rest.   May use Afrin nasal spray (or generic  oxymetazoline) each morning for about 5 days and then discontinue.  Also recommend using saline nasal spray several times daily and saline nasal irrigation (AYR is a common brand).  Use Flonase nasal spray each morning after using Afrin nasal spray and saline nasal irrigation. Try warm salt water gargles for sore throat.  Stop all antihistamines for now,  and other non-prescription cough/cold preparations. May take Ibuprofen 200mg , 4 tabs every 8 hours with food for chest/sternum discomfort. May take Delsym Cough Suppressant at bedtime for nighttime cough.   Followup with Family Doctor if not improved in about 10 days.  Final Clinical Impressions(s) / UC Diagnoses   Final diagnoses:  Viral URI with cough    ED Discharge Orders    None          Lattie HawBeese, Khaliya Golinski A, MD 06/30/17 [phone]

## 2017-06-28 NOTE — Discharge Instructions (Signed)
Take plain guaifenesin (1200mg  extended release tabs such as Mucinex) twice daily, with plenty of water, for cough and congestion.  Get adequate rest.   May use Afrin nasal spray (or generic oxymetazoline) each morning for about 5 days and then discontinue.  Also recommend using saline nasal spray several times daily and saline nasal irrigation (AYR is a common brand).  Use Flonase nasal spray each morning after using Afrin nasal spray and saline nasal irrigation. Try warm salt water gargles for sore throat.  Stop all antihistamines for now, and other non-prescription cough/cold preparations. May take Ibuprofen 200mg , 4 tabs every 8 hours with food for chest/sternum discomfort. May take Delsym Cough Suppressant at bedtime for nighttime cough.

## 2017-06-28 NOTE — ED Triage Notes (Signed)
Pt c/o cough, sore throat and nasal drainage x3 days. Afebrile and no meds.

## 2017-12-02 ENCOUNTER — Emergency Department
Admission: EM | Admit: 2017-12-02 | Discharge: 2017-12-02 | Disposition: A | Discharge status: Home / Self Care | Payer: Federal, State, Local not specified - PPO | Source: Home / Self Care | Attending: Family Medicine | Admitting: Family Medicine

## 2017-12-02 ENCOUNTER — Other Ambulatory Visit: Payer: Self-pay

## 2017-12-02 DIAGNOSIS — H938X3 Other specified disorders of ear, bilateral: Secondary | ICD-10-CM | POA: Diagnosis not present

## 2017-12-02 DIAGNOSIS — H9201 Otalgia, right ear: Secondary | ICD-10-CM

## 2017-12-02 DIAGNOSIS — R0981 Nasal congestion: Secondary | ICD-10-CM

## 2017-12-02 MED ORDER — AZITHROMYCIN 250 MG PO TABS: 250.0000 mg | ORAL_TABLET | Freq: Every day | ORAL | 0 refills | Status: DC

## 2017-12-02 MED ORDER — FLUCONAZOLE 200 MG PO TABS: 200.0000 mg | ORAL_TABLET | Freq: Once | ORAL | 0 refills | Status: AC

## 2017-12-02 NOTE — ED Provider Notes (Signed)
[facility]    CSN: [id number] Arrival date & time: 12/02/17  1545     History   Chief Complaint Chief Complaint  Patient presents with  . Ear Fullness  . scratchy throat    HPI Barbara Wall is a 57 y.o. female.   HPI Barbara Wall is a 57 y.o. female presenting to UC with c/o 4-5 days of sneezing that has since developed into bilateral ear fullness for about 3 days. Minimal pain in Right ear. Throat has also become scratchy. She has tried Flonase once but was unsure if she should continue using it. She has not tried any OTC allergy medications such as Zyrtec or Claritin because the last time she tried Allegra-D, her heart was racing and she did not like how it made her feel. Denies fever, chills, n/v/d. No known sick contacts.    Past Medical History:  Diagnosis Date  . Anxiety   . Cervical spondylosis   . Chronic fatigue syndrome   . Dermatillomania   . Dysthymia   . Fibromyalgia   . Hypercholesteremia   . Hypotension   . Idiopathic neuropathy   . Obesity   . Plantar fasciitis   . Vertigo     Patient Active Problem List   Diagnosis Date Noted  . Right ankle sprain 09/15/2016  . Fibromyalgia 05/28/2015  . Obesity 05/10/2015  . Low back pain without sciatica 03/26/2015  . Trigger middle finger of right hand 02/26/2015    Past Surgical History:  Procedure Laterality Date  . ABDOMINAL HYSTERECTOMY    . ADENOIDECTOMY    . CARPAL TUNNEL RELEASE    . tenosynovitis    . TONSILLECTOMY    . TUBAL LIGATION      OB History    Gravida  2   Para  2   Term  2   Preterm      AB      Living        SAB      TAB      Ectopic      Multiple      Live Births  2            Home Medications    Prior to Admission medications   Medication Sig Start Date End Date Taking? Authorizing Provider  azithromycin (ZITHROMAX) 250 MG tablet Take 1 tablet (250 mg total) by mouth daily. Take first 2 tablets together, then 1 every day until  finished. 12/02/17   Lurene Shadow, PA-C  clonazePAM (KLONOPIN) 0.5 MG tablet Take 0.5 mg by mouth at bedtime as needed.    [provider]  fluconazole (DIFLUCAN) 200 MG tablet Take 1 tablet (200 mg total) by mouth once for 1 dose. 12/02/17 12/02/17  Lurene Shadow, PA-C  hydrocortisone-pramoxine [facility]) 2.5-1 % rectal cream APPLY ON THE SKIN AS DIRECTED AS NEEDED 02/24/17   [provider]  l-methylfolate-B6-B12 (METANX) 3-35-2 MG TABS Take 1 tablet by mouth daily.    [provider]  levothyroxine (SYNTHROID, LEVOTHROID) 100 MCG tablet  05/21/17   [provider]  nystatin-triamcinolone ointment (MYCOLOG) APPLY TO AFFECTED AREA TWICE A DAY 05/19/17   [provider]  Pramoxine HCl 1 % CREA Apply 1 Dose topically as needed. 02/18/17 02/19/20  [provider]  Vitamin D, Ergocalciferol, (DRISDOL) 50000 UNITS CAPS Take 5,000 Units by mouth.    [provider]    Family History Family History  Problem Relation Age of Onset  . Thyroid  disease Mother   . Heart failure Father   . Diabetes Father   . Cancer Father     Social History Social History   Tobacco Use  . Smoking status: Never Smoker  . Smokeless tobacco: Never Used  Substance Use Topics  . Alcohol use: No  . Drug use: No     Allergies   Amoxicillin; Avelox [moxifloxacin hcl in nacl]; Clindamycin/lincomycin; Erythromycin; Flagyl [metronidazole hcl]; Macrobid [nitrofurantoin macrocrystal]; Macrolides and ketolides; Penicillins; and Sulfa antibiotics   Review of Systems Review of Systems  Constitutional: Negative for chills and fever.  HENT: Positive for congestion, ear pain (bilateral fullness, Right > Left), postnasal drip, rhinorrhea, sinus pressure and sneezing. Negative for sore throat, trouble swallowing and voice change.   Respiratory: Negative for cough and shortness of breath.   Cardiovascular: Negative for chest pain and palpitations.  Gastrointestinal:  Negative for abdominal pain, diarrhea, nausea and vomiting.  Musculoskeletal: Negative for arthralgias, back pain and myalgias.  Skin: Negative for rash.     Physical Exam Triage Vital Signs ED Triage Vitals  Enc Vitals Group     BP 12/02/17 1603 122/81     Pulse Rate 12/02/17 1603 83     Resp --      Temp 12/02/17 1603 98.3 F (36.8 C)     Temp Source 12/02/17 1603 Oral     SpO2 12/02/17 1603 98 %     Weight 12/02/17 1604 249 lb (112.9 kg)     Height 12/02/17 1604 5\' 5"  (1.651 m)     Head Circumference --      Peak Flow --      Pain Score 12/02/17 1604 0     Pain Loc --      Pain Edu? --      Excl. in GC? --    No data found.  Updated Vital Signs BP 122/81 (BP Location: Right Arm)   Pulse 83   Temp 98.3 F (36.8 C) (Oral)   Ht 5\' 5"  (1.651 m)   Wt 249 lb (112.9 kg)   SpO2 98%   BMI 41.44 kg/m   Visual Acuity Right Eye Distance:   Left Eye Distance:   Bilateral Distance:    Right Eye Near:   Left Eye Near:    Bilateral Near:     Physical Exam  Constitutional: She is oriented to person, place, and time. She appears well-developed and well-nourished. No distress.  HENT:  Head: Normocephalic and atraumatic.  Right Ear: Tympanic membrane is injected. Tympanic membrane is not bulging. A middle ear effusion is present.  Left Ear: Tympanic membrane is injected. Tympanic membrane is not bulging.  Nose: Nose normal.  Mouth/Throat: Uvula is midline, oropharynx is clear and moist and mucous membranes are normal.  Eyes: EOM are normal.  Neck: Normal range of motion. Neck supple.  Cardiovascular: Normal rate and regular rhythm.  Pulmonary/Chest: Effort normal and breath sounds normal. No stridor. No respiratory distress. She has no wheezes. She has no rales.  Musculoskeletal: Normal range of motion.  Neurological: She is alert and oriented to person, place, and time.  Skin: Skin is warm and dry. She is not diaphoretic.  Psychiatric: She has a normal mood and affect.  Her behavior is normal.  Nursing note and vitals reviewed.    UC Treatments / Results  Labs (all labs ordered are listed, but only abnormal results are displayed) Labs Reviewed - No data to display  EKG None  Radiology No results found.  Procedures  Procedures (including critical care time)  Medications Ordered in UC Medications - No data to display  Initial Impression / Assessment and Plan / UC Course  I have reviewed the triage vital signs and the nursing notes.  Pertinent labs & imaging results that were available during my care of the patient were reviewed by me and considered in my medical decision making (see chart for details).     Tympanogram: Right ear- Wide     Left ear- Normal   Possible early Right AOM Vs allergies Encouraged symptomatic treatment. Discussed sinus rinses.   Final Clinical Impressions(s) / UC Diagnoses   Final diagnoses:  Ear fullness, bilateral  Right ear pain  Sinus congestion   Discharge Instructions   None    ED Prescriptions    Medication Sig Dispense Auth. Provider   azithromycin (ZITHROMAX) 250 MG tablet Take 1 tablet (250 mg total) by mouth daily. Take first 2 tablets together, then 1 every day until finished. 6 tablet Doroteo GlassmanPhelps, Lizza Huffaker O, PA-C   fluconazole (DIFLUCAN) 200 MG tablet Take 1 tablet (200 mg total) by mouth once for 1 dose. 1 tablet Lurene ShadowPhelps, Honest Safranek O, PA-C     Controlled Substance Prescriptions [place] Controlled Substance Registry consulted? Not Applicable   Rolla Platehelps, Domini Vandehei O, PA-C 12/02/17 1650

## 2017-12-02 NOTE — ED Triage Notes (Signed)
Pt has been sneezing a lot the last few days, and Tuesday evening ears felt full.  Today, her throat has become scratchy.

## 2017-12-03 ENCOUNTER — Telehealth: Payer: Self-pay

## 2018-08-09 IMAGING — DX DG ANKLE COMPLETE 3+V*R*
3 series · 3 of 3 positions shown · non-contrast
Comparison: None

CLINICAL DATA: Tripped over a root yesterday in the yard twisting
RIGHT ankle, inversion injury, lateral malleolar pain and swelling
RIGHT ankle

EXAM:
RIGHT ANKLE - COMPLETE 3+ VIEW

[ankle ap]
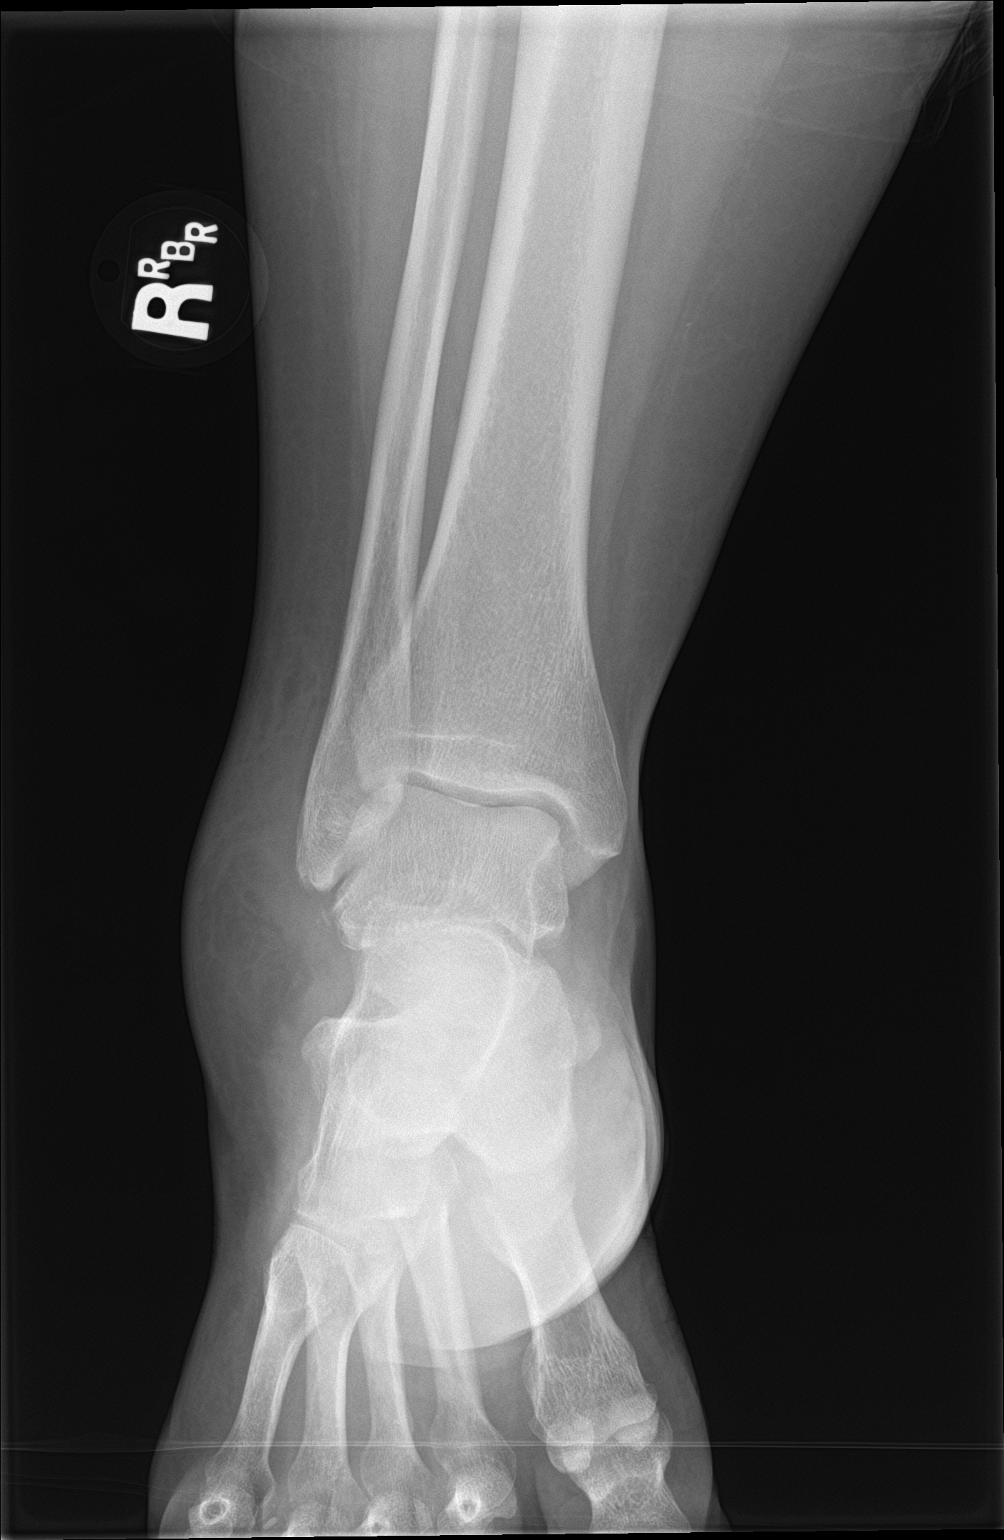

[ankle obl]
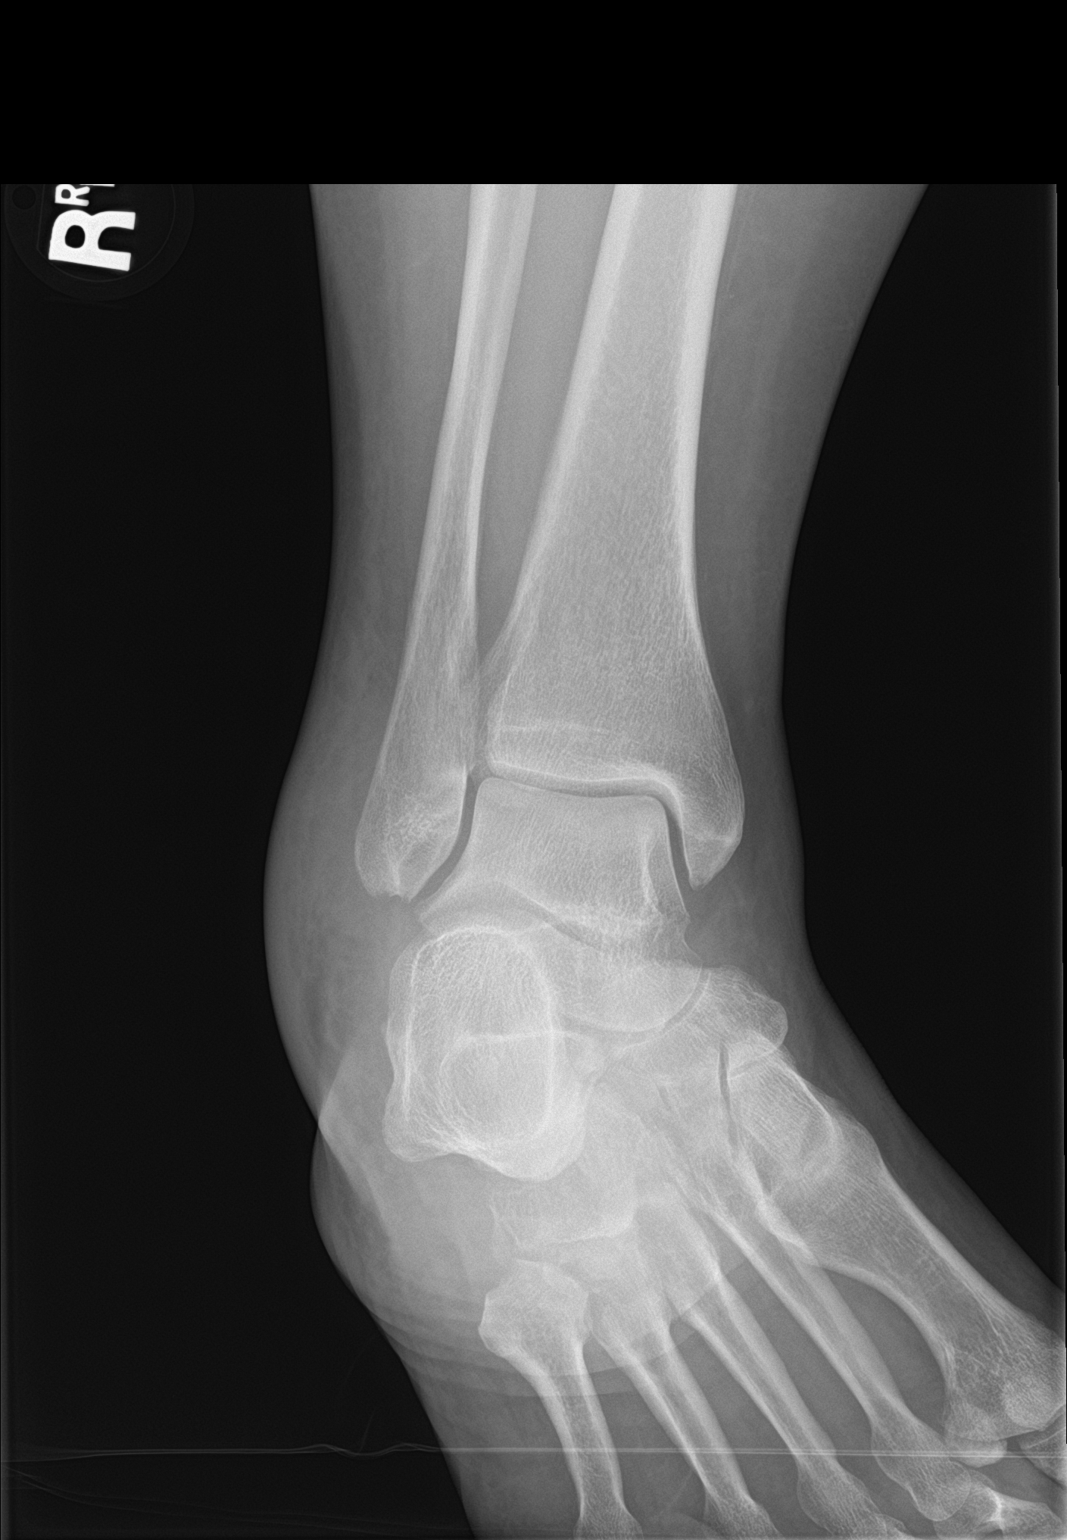

[ankle lat]
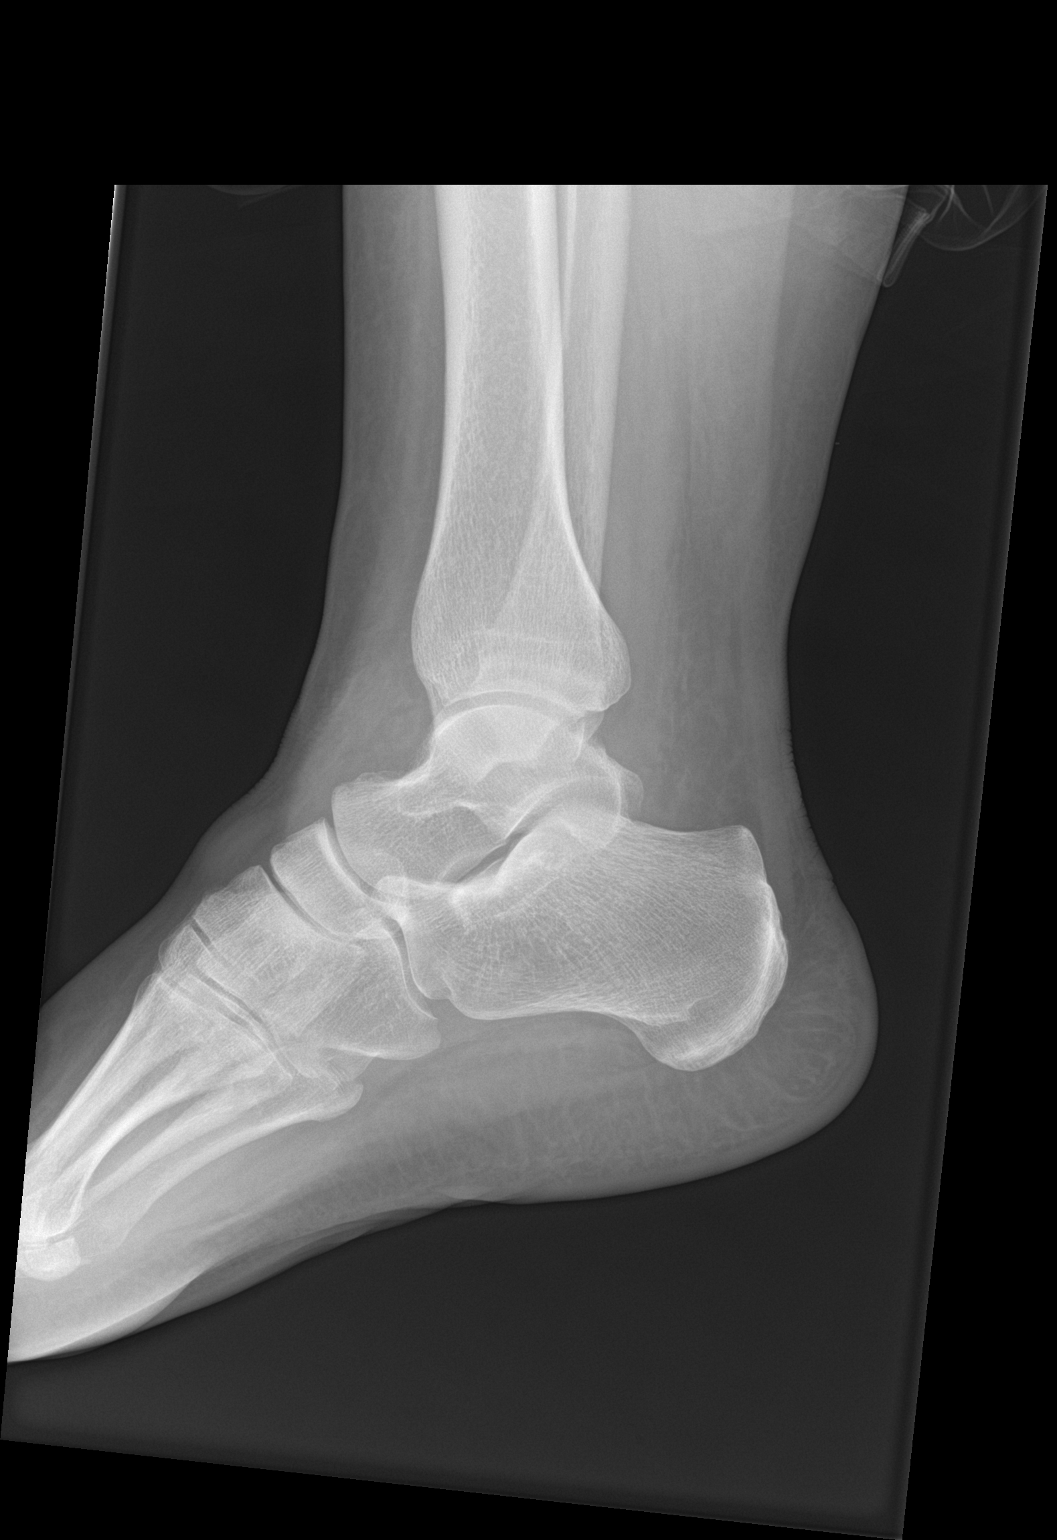

[3 of 3 positions shown; findings below may reference images not displayed]

FINDINGS: Significant soft tissue swelling laterally extending anteriorly.

Osseous mineralization normal.

Joint spaces preserved.

Tiny bony densities are seen at the lateral ankle, uncertain if
represents avulsion fragments arising from the talus or lateral
malleolar tip.

No additional fracture, dislocation, or bone destruction.
IMPRESSION: Potential tiny avulsion fragments at the lateral ankle, uncertain if
arising from the lateral margin of the talus or less likely the
lateral malleolar tip.

## 2020-08-11 ENCOUNTER — Other Ambulatory Visit: Payer: Self-pay

## 2020-08-11 ENCOUNTER — Encounter: Payer: Self-pay | Admitting: Emergency Medicine

## 2020-08-11 ENCOUNTER — Emergency Department
Admission: EM | Admit: 2020-08-11 | Discharge: 2020-08-11 | Disposition: A | Discharge status: Home / Self Care | Payer: Federal, State, Local not specified - PPO | Source: Home / Self Care | Attending: Family Medicine | Admitting: Family Medicine

## 2020-08-11 DIAGNOSIS — R0789 Other chest pain: Secondary | ICD-10-CM | POA: Diagnosis not present

## 2020-08-11 MED ORDER — TRAMADOL-ACETAMINOPHEN 37.5-325 MG PO TABS: 2.0000 | ORAL_TABLET | Freq: Four times a day (QID) | ORAL | 0 refills | Status: DC | PRN

## 2020-08-11 MED ORDER — PREDNISONE 20 MG PO TABS: 20.0000 mg | ORAL_TABLET | Freq: Two times a day (BID) | ORAL | 0 refills | Status: DC

## 2020-08-11 NOTE — Discharge Instructions (Addendum)
Take prednisone 2 x a day for 5 days Take 2 doses today Take the ultracet as needed for pain After the prednisone take ibuprofen 2-3 tablets 3 times a day with food

## 2020-08-11 NOTE — ED Provider Notes (Signed)
[facility]    CSN: [id number] Arrival date & time: 08/11/20  1223      History   Chief Complaint Chief Complaint  Patient presents with  . Chest Pain    Rib pain    HPI Barbara Wall is a 60 y.o. female.   HPI   Tried to open the top of a heavy cardboard box on Thursday.  Pulled on it forcibly.  Felt a twinge in her anterior chest.  Since then has had increasing anterior chest pain.  Hurts with deep breath, cough, sneeze.  She has had costochondritis before.  This feels similar.  She has been taking Advil as needed with brief improvement.  She does have an anxiety condition.  Chest pain increases her anxiety even though she knows that it is not likely cardiac. Patient has fibromyalgia.   Past Medical History:  Diagnosis Date  . Anxiety   . Cervical spondylosis   . Chronic fatigue syndrome   . Dermatillomania   . Dysthymia   . Fibromyalgia   . Hypercholesteremia   . Hypotension   . Idiopathic neuropathy   . Obesity   . Plantar fasciitis   . Vertigo     Patient Active Problem List   Diagnosis Date Noted  . Right ankle sprain 09/15/2016  . Fibromyalgia 05/28/2015  . Obesity 05/10/2015  . Low back pain without sciatica 03/26/2015  . Trigger middle finger of right hand 02/26/2015    Past Surgical History:  Procedure Laterality Date  . ABDOMINAL HYSTERECTOMY    . ADENOIDECTOMY    . CARPAL TUNNEL RELEASE    . tenosynovitis    . TONSILLECTOMY    . TUBAL LIGATION      OB History    Gravida  2   Para  2   Term  2   Preterm      AB      Living        SAB      IAB      Ectopic      Multiple      Live Births  2            Home Medications    Prior to Admission medications   Medication Sig Start Date End Date Taking? Authorizing Provider  clonazePAM (KLONOPIN) 0.5 MG tablet Take 0.5 mg by mouth at bedtime as needed.   Yes [provider]  fluticasone (FLONASE) 50 MCG/ACT nasal spray Place 2 sprays into both  nostrils daily.   Yes [provider]  l-methylfolate-B6-B12 (METANX) 3-35-2 MG TABS Take 1 tablet by mouth daily.   Yes [provider]  levothyroxine (SYNTHROID, LEVOTHROID) 100 MCG tablet  05/21/17  Yes [provider]  predniSONE (DELTASONE) 20 MG tablet Take 1 tablet (20 mg total) by mouth 2 (two) times daily with a meal. 08/11/20  Yes Eustace Moore, MD  traMADol-acetaminophen (ULTRACET) 37.5-325 MG tablet Take 2 tablets by mouth every 6 (six) hours as needed. 08/11/20  Yes Eustace Moore, MD  Vitamin D, Ergocalciferol, (DRISDOL) 50000 UNITS CAPS Take 5,000 Units by mouth.   Yes [provider]    Family History Family History  Problem Relation Age of Onset  . Thyroid disease Mother   . Heart failure Father   . Diabetes Father   . Cancer Father     Social History Social History   Tobacco Use  . Smoking status: Never Smoker  . Smokeless tobacco: Never Used  Vaping Use  .  Vaping Use: Never used  Substance Use Topics  . Alcohol use: No  . Drug use: No     Allergies   Amoxicillin, Avelox [moxifloxacin hcl in nacl], Clindamycin/lincomycin, Erythromycin, Flagyl [metronidazole hcl], Macrobid [nitrofurantoin macrocrystal], Macrolides and ketolides, Penicillins, and Sulfa antibiotics   Review of Systems Review of Systems See HPI  Physical Exam Triage Vital Signs ED Triage Vitals  Enc Vitals Group     BP 08/11/20 1240 131/77     Pulse Rate 08/11/20 1240 90     Resp 08/11/20 1240 16     Temp 08/11/20 1240 98.7 F (37.1 C)     Temp Source 08/11/20 1240 Oral     SpO2 08/11/20 1240 97 %     Weight --      Height --      Head Circumference --      Peak Flow --      Pain Score 08/11/20 1237 6     Pain Loc --      Pain Edu? --      Excl. in GC? --    No data found.  Updated Vital Signs BP 131/77 (BP Location: Left Arm)   Pulse 90   Temp 98.7 F (37.1 C) (Oral)   Resp 16   SpO2 97%      Physical Exam Constitutional:       General: She is not in acute distress.    Appearance: She is well-developed.     Comments: Overweight.  No acute distress  HENT:     Head: Normocephalic and atraumatic.  Eyes:     Conjunctiva/sclera: Conjunctivae normal.     Pupils: Pupils are equal, round, and reactive to light.  Cardiovascular:     Rate and Rhythm: Normal rate and regular rhythm.     Heart sounds: Normal heart sounds.  Pulmonary:     Effort: Pulmonary effort is normal. No respiratory distress.     Breath sounds: Normal breath sounds.     Comments: Anterior chest to right Chest:     Chest wall: Tenderness present.  Abdominal:     General: There is no distension.     Palpations: Abdomen is soft.  Musculoskeletal:        General: Normal range of motion.     Cervical back: Normal range of motion.  Skin:    General: Skin is warm and dry.  Neurological:     Mental Status: She is alert.  Psychiatric:        Behavior: Behavior normal.      UC Treatments / Results  Labs (all labs ordered are listed, but only abnormal results are displayed) Labs Reviewed - No data to display  EKG   Radiology No results found.  Procedures Procedures (including critical care time)  Medications Ordered in UC Medications - No data to display  Initial Impression / Assessment and Plan / UC Course  I have reviewed the triage vital signs and the nursing notes.  Pertinent labs & imaging results that were available during my care of the patient were reviewed by me and considered in my medical decision making (see chart for details).     Costochondritis.  Treat with prednisone followed by ibuprofen.  Ultracet for pain.  Follow-up with PCP Final Clinical Impressions(s) / UC Diagnoses   Final diagnoses:  Chest wall pain     Discharge Instructions     Take prednisone 2 x a day for 5 days Take 2 doses today Take the ultracet  as needed for pain After the prednisone take ibuprofen 2-3 tablets 3 times a day with  food    ED Prescriptions    Medication Sig Dispense Auth. Provider   predniSONE (DELTASONE) 20 MG tablet Take 1 tablet (20 mg total) by mouth 2 (two) times daily with a meal. 10 tablet Eustace Moore, MD   traMADol-acetaminophen (ULTRACET) 37.5-325 MG tablet Take 2 tablets by mouth every 6 (six) hours as needed. 20 tablet Eustace Moore, MD     I have reviewed the PDMP during this encounter.   Eustace Moore, MD 08/11/20 [phone]

## 2020-08-11 NOTE — ED Triage Notes (Signed)
Patient presents to Urgent Care with complaints of rib pain since 3 days ago. Patient reports pain of chest pain, above right breast and around right rib area. Has been taking Aleve, using heat. History of costochondritis.

## 2020-09-10 DIAGNOSIS — F341 Dysthymic disorder: Secondary | ICD-10-CM | POA: Insufficient documentation

## 2020-11-18 ENCOUNTER — Ambulatory Visit (INDEPENDENT_AMBULATORY_CARE_PROVIDER_SITE_OTHER): Payer: Federal, State, Local not specified - PPO | Admitting: Obstetrics & Gynecology

## 2020-11-18 ENCOUNTER — Other Ambulatory Visit: Payer: Self-pay

## 2020-11-18 ENCOUNTER — Encounter: Payer: Self-pay | Admitting: Obstetrics & Gynecology

## 2020-11-18 VITALS — BP 126/75 | HR 77 | Resp 16 | Ht 65.0 in | Wt 236.0 lb

## 2020-11-18 DIAGNOSIS — L292 Pruritus vulvae: Secondary | ICD-10-CM | POA: Diagnosis not present

## 2020-11-18 DIAGNOSIS — Z01419 Encounter for gynecological examination (general) (routine) without abnormal findings: Secondary | ICD-10-CM | POA: Diagnosis not present

## 2020-11-18 DIAGNOSIS — Z139 Encounter for screening, unspecified: Secondary | ICD-10-CM

## 2020-11-18 MED ORDER — NYSTATIN-TRIAMCINOLONE 100000-0.1 UNIT/GM-% EX OINT: TOPICAL_OINTMENT | CUTANEOUS | 0 refills | Status: AC

## 2020-11-18 NOTE — Progress Notes (Signed)
Would like a RF on Mycolog cream.

## 2020-11-18 NOTE — Progress Notes (Signed)
Subjective:      Subjective:     Barbara Wall is a 60 y.o. female here for a routine exam.  Current complaints: hasjoccasional vulvar discomfort and uses mycalog for relief (about 1x per week).  She owuld like a refill.    Gynecologic History No LMP recorded. Patient has had a hysterectomy. Contraception: post menopausal status Last Pap: none on chart; s/p hysterectomy; do not have notes; ? History of abnormal pap smear. Getting more information from patient Last mammogram: 02/2020. Results were: Bir[facility] 2 (care everywhere)  Obstetric History OB History  Gravida Para Term Preterm AB Living  2 2 2         SAB IAB Ectopic Multiple Live Births          2    # Outcome Date GA Lbr Len/2nd Weight Sex Delivery Anes PTL Lv  2 Term      Vag-Spont     1 Term      Vag-Spont        The following portions of the patient's history were reviewed and updated as appropriate: allergies, current medications, past family history, past medical history, past social history, past surgical history, and problem list.  Review of Systems Pertinent items noted in HPI and remainder of comprehensive ROS otherwise negative.    Objective:     Vitals:   11/18/20 1448  BP: 126/75  Pulse: 77  Resp: 16  Weight: 236 lb (107 kg)  Height: 5\' 5"  (1.651 m)   Vitals:  WNL General appearance: alert, cooperative and no distress  HEENT: Normocephalic, without obvious abnormality, atraumatic Eyes: negative Throat: lips, mucosa, and tongue normal; teeth and gums normal  Respiratory: Clear to auscultation bilaterally  CV: Regular rate and rhythm  Breasts:  Normal appearance, no masses or tenderness, no nipple retraction or dimpling  GI: Soft, non-tender; bowel sounds normal; no masses,  no organomegaly  GU: External Genitalia:  Tanner V, no lesion Urethra:  No prolapse   Vagina: Pink, normal rugae, no blood or discharge  Cervix: Surgically absent  Uterus:  Surgically absent  Adnexa: Normal, no masses, non  tender, limited by habitus  Musculoskeletal: No edema, redness or tenderness in the calves or thighs  Skin: No lesions or rash  Lymphatic: Axillary adenopathy: none     Psychiatric: Normal mood and behavior        Assessment:    Healthy female exam.    Plan:   Pt due for Dexa.  She is managed by PCP.  Dexa ordered and she will review with her PCP at her upcoming annual exam.  1200 mg Calcium through diet and supplements suggested.  Continue vit d Need more information about history o abnormal pap.  Would also appreciate op report and pathology of hysterectomy.    3.  Yearly mammograms 4.  Mycolog renewed.  If patient uses more frequently, she needs to come in for exam.   5.  PCP ordered cologuard for patient and follows her screening.

## 2020-11-20 ENCOUNTER — Other Ambulatory Visit: Payer: Self-pay

## 2020-11-20 ENCOUNTER — Ambulatory Visit (INDEPENDENT_AMBULATORY_CARE_PROVIDER_SITE_OTHER): Payer: Federal, State, Local not specified - PPO

## 2020-11-20 DIAGNOSIS — Z13820 Encounter for screening for osteoporosis: Secondary | ICD-10-CM | POA: Diagnosis not present

## 2020-11-20 DIAGNOSIS — Z139 Encounter for screening, unspecified: Secondary | ICD-10-CM

## 2020-11-24 ENCOUNTER — Encounter: Payer: Self-pay | Admitting: Obstetrics & Gynecology

## 2020-11-26 ENCOUNTER — Encounter: Payer: Self-pay | Admitting: *Deleted

## 2020-11-26 NOTE — Progress Notes (Signed)
Pt provided records from [place].  She signed ROI to get records from [facility] [place].

## 2020-12-09 ENCOUNTER — Encounter: Payer: Self-pay | Admitting: Obstetrics & Gynecology

## 2020-12-09 DIAGNOSIS — M8580 Other specified disorders of bone density and structure, unspecified site: Secondary | ICD-10-CM | POA: Insufficient documentation

## 2020-12-09 DIAGNOSIS — Z780 Asymptomatic menopausal state: Secondary | ICD-10-CM | POA: Insufficient documentation

## 2020-12-15 ENCOUNTER — Encounter: Payer: Self-pay | Admitting: Obstetrics & Gynecology
